# Patient Record
Sex: Male | Born: 1955 | Race: White | Hispanic: No | Marital: Married | Smoking: Former smoker
Health system: Southern US, Community
[De-identification: ages and names within clinical notes are randomized; demographics above are authoritative.]

## PROBLEM LIST (undated history)

## (undated) DIAGNOSIS — K219 Gastro-esophageal reflux disease without esophagitis: Secondary | ICD-10-CM

## (undated) DIAGNOSIS — I2510 Atherosclerotic heart disease of native coronary artery without angina pectoris: Secondary | ICD-10-CM

## (undated) DIAGNOSIS — R7303 Prediabetes: Secondary | ICD-10-CM

## (undated) DIAGNOSIS — I10 Essential (primary) hypertension: Secondary | ICD-10-CM

## (undated) DIAGNOSIS — R011 Cardiac murmur, unspecified: Secondary | ICD-10-CM

## (undated) DIAGNOSIS — M1990 Unspecified osteoarthritis, unspecified site: Secondary | ICD-10-CM

## (undated) HISTORY — DX: Atherosclerotic heart disease of native coronary artery without angina pectoris: I25.10

## (undated) HISTORY — PX: WISDOM TOOTH EXTRACTION: SHX21

## (undated) HISTORY — PX: ARTHROSCOPY KNEE W/ DRILLING: SUR92

## (undated) HISTORY — DX: Cardiac murmur, unspecified: R01.1

## (undated) HISTORY — PX: KNEE ARTHROSCOPY: SUR90

## (undated) SURGERY — COLONOSCOPY
Anesthesia: Monitor Anesthesia Care

---

## 2002-05-08 ENCOUNTER — Encounter: Payer: Self-pay | Admitting: Internal Medicine

## 2002-05-08 ENCOUNTER — Ambulatory Visit (HOSPITAL_COMMUNITY): Admission: RE | Admit: 2002-05-08 | Discharge: 2002-05-08 | Payer: Self-pay | Admitting: Internal Medicine

## 2014-11-01 ENCOUNTER — Other Ambulatory Visit: Payer: Self-pay | Admitting: Otolaryngology

## 2014-11-01 DIAGNOSIS — H905 Unspecified sensorineural hearing loss: Secondary | ICD-10-CM

## 2014-11-09 ENCOUNTER — Ambulatory Visit
Admission: RE | Admit: 2014-11-09 | Discharge: 2014-11-09 | Disposition: A | Discharge status: Home / Self Care | Payer: Self-pay | Source: Ambulatory Visit | Attending: Otolaryngology | Admitting: Otolaryngology

## 2014-11-09 DIAGNOSIS — H905 Unspecified sensorineural hearing loss: Secondary | ICD-10-CM

## 2014-11-09 MED ORDER — GADOBENATE DIMEGLUMINE 529 MG/ML IV SOLN
19.0000 mL | Freq: Once | INTRAVENOUS | Status: AC | PRN
  Administered 2014-11-09: 19 mL via INTRAVENOUS

## 2016-03-20 DIAGNOSIS — L821 Other seborrheic keratosis: Secondary | ICD-10-CM | POA: Diagnosis not present

## 2016-03-20 DIAGNOSIS — D229 Melanocytic nevi, unspecified: Secondary | ICD-10-CM | POA: Diagnosis not present

## 2016-03-20 DIAGNOSIS — L570 Actinic keratosis: Secondary | ICD-10-CM | POA: Diagnosis not present

## 2016-05-27 DIAGNOSIS — E785 Hyperlipidemia, unspecified: Secondary | ICD-10-CM | POA: Diagnosis not present

## 2016-05-27 DIAGNOSIS — R739 Hyperglycemia, unspecified: Secondary | ICD-10-CM | POA: Diagnosis not present

## 2016-05-27 DIAGNOSIS — Z79899 Other long term (current) drug therapy: Secondary | ICD-10-CM | POA: Diagnosis not present

## 2016-07-19 DIAGNOSIS — M1712 Unilateral primary osteoarthritis, left knee: Secondary | ICD-10-CM | POA: Diagnosis not present

## 2016-08-30 DIAGNOSIS — M1712 Unilateral primary osteoarthritis, left knee: Secondary | ICD-10-CM | POA: Diagnosis not present

## 2016-09-27 DIAGNOSIS — M1712 Unilateral primary osteoarthritis, left knee: Secondary | ICD-10-CM | POA: Diagnosis not present

## 2016-10-01 DIAGNOSIS — M1712 Unilateral primary osteoarthritis, left knee: Secondary | ICD-10-CM | POA: Diagnosis not present

## 2016-10-01 DIAGNOSIS — M238X2 Other internal derangements of left knee: Secondary | ICD-10-CM | POA: Diagnosis not present

## 2016-10-15 DIAGNOSIS — G8918 Other acute postprocedural pain: Secondary | ICD-10-CM | POA: Diagnosis not present

## 2016-10-15 DIAGNOSIS — M659 Synovitis and tenosynovitis, unspecified: Secondary | ICD-10-CM | POA: Diagnosis not present

## 2016-10-15 DIAGNOSIS — M23322 Other meniscus derangements, posterior horn of medial meniscus, left knee: Secondary | ICD-10-CM | POA: Diagnosis not present

## 2016-10-15 DIAGNOSIS — M94262 Chondromalacia, left knee: Secondary | ICD-10-CM | POA: Diagnosis not present

## 2016-10-15 DIAGNOSIS — M23222 Derangement of posterior horn of medial meniscus due to old tear or injury, left knee: Secondary | ICD-10-CM | POA: Diagnosis not present

## 2016-11-27 DIAGNOSIS — Z1211 Encounter for screening for malignant neoplasm of colon: Secondary | ICD-10-CM | POA: Diagnosis not present

## 2016-11-27 DIAGNOSIS — R739 Hyperglycemia, unspecified: Secondary | ICD-10-CM | POA: Diagnosis not present

## 2016-11-27 DIAGNOSIS — D7589 Other specified diseases of blood and blood-forming organs: Secondary | ICD-10-CM | POA: Diagnosis not present

## 2016-11-27 DIAGNOSIS — E785 Hyperlipidemia, unspecified: Secondary | ICD-10-CM | POA: Diagnosis not present

## 2016-11-27 DIAGNOSIS — I10 Essential (primary) hypertension: Secondary | ICD-10-CM | POA: Diagnosis not present

## 2016-11-27 DIAGNOSIS — Z23 Encounter for immunization: Secondary | ICD-10-CM | POA: Diagnosis not present

## 2017-06-03 DIAGNOSIS — I10 Essential (primary) hypertension: Secondary | ICD-10-CM | POA: Diagnosis not present

## 2017-06-03 DIAGNOSIS — E785 Hyperlipidemia, unspecified: Secondary | ICD-10-CM | POA: Diagnosis not present

## 2017-06-03 DIAGNOSIS — R739 Hyperglycemia, unspecified: Secondary | ICD-10-CM | POA: Diagnosis not present

## 2017-08-27 DIAGNOSIS — S91302A Unspecified open wound, left foot, initial encounter: Secondary | ICD-10-CM | POA: Diagnosis not present

## 2017-12-02 DIAGNOSIS — I10 Essential (primary) hypertension: Secondary | ICD-10-CM | POA: Diagnosis not present

## 2017-12-02 DIAGNOSIS — E785 Hyperlipidemia, unspecified: Secondary | ICD-10-CM | POA: Diagnosis not present

## 2017-12-02 DIAGNOSIS — R739 Hyperglycemia, unspecified: Secondary | ICD-10-CM | POA: Diagnosis not present

## 2017-12-02 DIAGNOSIS — Z23 Encounter for immunization: Secondary | ICD-10-CM | POA: Diagnosis not present

## 2018-02-24 DIAGNOSIS — Z0389 Encounter for observation for other suspected diseases and conditions ruled out: Secondary | ICD-10-CM | POA: Diagnosis not present

## 2018-03-06 ENCOUNTER — Other Ambulatory Visit: Payer: Self-pay | Admitting: Family Medicine

## 2018-03-06 ENCOUNTER — Ambulatory Visit
Admission: RE | Admit: 2018-03-06 | Discharge: 2018-03-06 | Disposition: A | Discharge status: Home / Self Care | Payer: 59 | Source: Ambulatory Visit | Attending: Family Medicine | Admitting: Family Medicine

## 2018-03-06 DIAGNOSIS — Q766 Other congenital malformations of ribs: Secondary | ICD-10-CM

## 2018-03-06 DIAGNOSIS — M954 Acquired deformity of chest and rib: Secondary | ICD-10-CM | POA: Diagnosis not present

## 2018-11-24 ENCOUNTER — Other Ambulatory Visit: Payer: Self-pay | Admitting: Family Medicine

## 2018-11-24 DIAGNOSIS — E041 Nontoxic single thyroid nodule: Secondary | ICD-10-CM

## 2018-12-02 ENCOUNTER — Ambulatory Visit
Admission: RE | Admit: 2018-12-02 | Discharge: 2018-12-02 | Disposition: A | Discharge status: Home / Self Care | Payer: 59 | Source: Ambulatory Visit | Attending: Family Medicine | Admitting: Family Medicine

## 2018-12-02 ENCOUNTER — Other Ambulatory Visit: Payer: Self-pay

## 2018-12-02 DIAGNOSIS — E041 Nontoxic single thyroid nodule: Secondary | ICD-10-CM

## 2018-12-02 MED ORDER — IOPAMIDOL (ISOVUE-300) INJECTION 61%
75.0000 mL | Freq: Once | INTRAVENOUS | Status: AC | PRN
  Administered 2018-12-02: 75 mL via INTRAVENOUS

## 2019-05-16 ENCOUNTER — Ambulatory Visit: Payer: Self-pay | Attending: Internal Medicine

## 2019-05-16 DIAGNOSIS — Z23 Encounter for immunization: Secondary | ICD-10-CM | POA: Insufficient documentation

## 2019-05-16 NOTE — Progress Notes (Signed)
   Covid-19 Vaccination Clinic  Name:  TEMUJIN CAPTAIN    MRN: [id number] DOB: December 03, 1955  05/16/2019  Mr. Turton was observed post Covid-19 immunization for 15 minutes without incident. He was provided with Vaccine Information Sheet and instruction to access the V-Safe system.   Mr. Helming was instructed to call 911 with any severe reactions post vaccine: [name] Kitchen Difficulty breathing  . Swelling of face and throat  . A fast heartbeat  . A bad rash all over body  . Dizziness and weakness   Immunizations Administered    Name Date Dose VIS Date Route   Pfizer COVID-19 Vaccine 05/16/2019 12:51 PM 0.3 mL 02/19/2019 Intramuscular   Manufacturer: [place]   Lot: [id number]   [place]: [id number]

## 2019-06-07 ENCOUNTER — Ambulatory Visit: Payer: Self-pay | Attending: Internal Medicine

## 2019-06-07 DIAGNOSIS — Z23 Encounter for immunization: Secondary | ICD-10-CM

## 2019-06-07 NOTE — Progress Notes (Signed)
   Covid-19 Vaccination Clinic  Name:  Glen Carrillo    MRN: [id number] DOB: 1955-03-19  06/07/2019  Mr. Glen Carrillo was observed post Covid-19 immunization for 15 minutes without incident. He was provided with Vaccine Information Sheet and instruction to access the V-Safe system.   Mr. Glen Carrillo was instructed to call 911 with any severe reactions post vaccine: [name] Kitchen Difficulty breathing  . Swelling of face and throat  . A fast heartbeat  . A bad rash all over body  . Dizziness and weakness   Immunizations Administered    Name Date Dose VIS Date Route   Pfizer COVID-19 Vaccine 06/07/2019  8:08 AM 0.3 mL 02/19/2019 Intramuscular   Manufacturer: [place]   Lot: [id number]   [place]: [id number]

## 2019-06-15 ENCOUNTER — Ambulatory Visit: Payer: Self-pay

## 2020-09-19 ENCOUNTER — Other Ambulatory Visit: Payer: Self-pay | Admitting: Family Medicine

## 2020-09-19 DIAGNOSIS — Z122 Encounter for screening for malignant neoplasm of respiratory organs: Secondary | ICD-10-CM

## 2020-10-13 ENCOUNTER — Ambulatory Visit
Admission: RE | Admit: 2020-10-13 | Discharge: 2020-10-13 | Disposition: A | Discharge status: Home / Self Care | Payer: Medicare Other | Source: Ambulatory Visit | Attending: Family Medicine | Admitting: Family Medicine

## 2020-10-13 DIAGNOSIS — Z122 Encounter for screening for malignant neoplasm of respiratory organs: Secondary | ICD-10-CM

## 2020-12-12 NOTE — H&P (Signed)
  Patient's anticipated LOS is less than 2 midnights, meeting these requirements: - Younger than 81 - Lives within 1 hour of care - Has a competent adult at home to recover with post-op recover - NO history of  - Chronic pain requiring opiods  - Diabetes  - Coronary Artery Disease  - Heart failure  - Heart attack  - Stroke  - DVT/VTE  - Cardiac arrhythmia  - Respiratory Failure/COPD  - Renal failure  - Anemia  - Advanced Liver disease     ADYEN BIFULCO is an 65 y.o. male.    Chief Complaint: left shoulder pain  HPI: Pt is a 65 y.o. male complaining of left shoulder pain for multiple years. Pain had continually increased since the beginning. X-rays in the clinic show end-stage arthritic changes of the left shoulder. Pt has tried various conservative treatments which have failed to alleviate their symptoms, including injections and therapy. Various options are discussed with the patient. Risks, benefits and expectations were discussed with the patient. Patient understand the risks, benefits and expectations and wishes to proceed with surgery.   PCP:  Aura Dials, MD  D/C Plans: Home  PMH: No past medical history on file.    Social History:  has no history on file for tobacco use, alcohol use, and drug use.  Allergies:  Not on File  Medications: No current facility-administered medications for this encounter.   No current outpatient medications on file.    No results found for this or any previous visit (from the past 48 hour(s)). No results found.  ROS: Pain with rom of the left upper extremity  Physical Exam: Alert and oriented 65 y.o. male in no acute distress Cranial nerves 2-12 intact Cervical spine: full rom with no tenderness, nv intact distally Chest: active breath sounds bilaterally, no wheeze rhonchi or rales Heart: regular rate and rhythm, no murmur Abd: non tender non distended with active bowel sounds Hip is stable with rom  Left shoulder  painful rom with crepitus  Nv intact distally No rashes or edema distally  Assessment/Plan Assessment: left shoulder end stage osteoarthritis  Plan:  Patient will undergo a left total shoulder by Dr. Veverly Fells at [facility] Risks benefits and expectations were discussed with the patient. Patient understand risks, benefits and expectations and wishes to proceed. Preoperative templating of the joint replacement has been completed, documented, and submitted to the Operating Room personnel in order to optimize intra-operative equipment management.   Merla Riches PA-C, MPAS [facility] is now [employer] [address] [place], [place], [place] [address] Phone: [phone] [email or web address] Facebook  [employer]

## 2020-12-20 NOTE — Progress Notes (Signed)
Surgical Instructions    Your procedure is scheduled on 12/25/20.  Report to [facility] Main Entrance "A" at 8:40 A.M., then check in with the Admitting office.  Call this number if you have problems the morning of surgery:  [phone]   If you have any questions prior to your surgery date call [phone]: Open Monday-Friday 8am-4pm    Remember:  Do not eat after midnight the night before your surgery  You may drink clear liquids until 7:40 the morning of your surgery.   Clear liquids allowed are: Water, Non-Citrus Juices (without pulp), Carbonated Beverages, Clear Tea, Black Coffee ONLY (NO MILK, CREAM OR POWDERED CREAMER of any kind), and Gatorade  Please complete your PRE-SURGERY ENSURE that was provided to you by 7:40 am the morning of surgery.  Please, if able, drink it in one setting. DO NOT SIP.     Take these medicines the morning of surgery with A SIP OF WATER:  atorvastatin (LIPITOR)  omeprazole (PRILOSEC) acetaminophen (TYLENOL) - if needed   As of today, STOP taking any Aspirin (unless otherwise instructed by your surgeon) Aleve, Naproxen, Ibuprofen, Motrin, Advil, Goody's, BC's, all herbal medications, fish oil, and all vitamins.   After your COVID test   You are not required to quarantine however you are required to wear a well-fitting mask when you are out and around people not in your household.  If your mask becomes wet or soiled, replace with a new one.  Wash your hands often with soap and water for 20 seconds or clean your hands with an alcohol-based hand sanitizer that contains at least 60% alcohol.  Do not share personal items.  Notify your provider: if you are in close contact with someone who has COVID  or if you develop a fever of 100.4 or greater, sneezing, cough, sore throat, shortness of breath or body aches.           Do not wear jewelry  Do not wear lotions, powders, colognes, or deodorant. Do not shave 48 hours prior to surgery.  Men may  shave face and neck. Do not bring valuables to the hospital.              [facility] is not responsible for any belongings or valuables.  Do NOT Smoke (Tobacco/Vaping)  24 hours prior to your procedure  If you use a CPAP at night, you may bring your mask for your overnight stay.   Contacts, glasses, hearing aids, dentures or partials may not be worn into surgery, please bring cases for these belongings   For patients admitted to the hospital, discharge time will be determined by your treatment team.   Patients discharged the day of surgery will not be allowed to drive home, and someone needs to stay with them for 24 hours.  NO VISITORS WILL BE ALLOWED IN PRE-OP WHERE PATIENTS ARE PREPPED FOR SURGERY.  ONLY 1 SUPPORT PERSON MAY BE PRESENT IN THE WAITING ROOM WHILE YOU ARE IN SURGERY.  IF YOU ARE TO BE ADMITTED, ONCE YOU ARE IN YOUR ROOM YOU WILL BE ALLOWED TWO (2) VISITORS. 1 (ONE) VISITOR MAY STAY OVERNIGHT BUT MUST ARRIVE TO THE ROOM BY 8pm.  Minor children may have two parents present. Special consideration for safety and communication needs will be reviewed on a case by case basis.  Special instructions:    Oral Hygiene is also important to reduce your risk of infection.  Remember - BRUSH YOUR TEETH THE MORNING OF SURGERY WITH YOUR REGULAR  TOOTHPASTE                                  [facility]- Preparing for Total Shoulder Arthroplasty   Before surgery, you can play an important role. Because skin is not sterile, your skin needs to be as free of germs as possible. You can reduce the number of germs on your skin by using the following products. Benzoyl Peroxide Gel Reduces the number of germs present on the skin Applied twice a day to shoulder area starting two days before surgery   Chlorhexidine Gluconate (CHG) Soap An antiseptic cleaner that kills germs and bonds with the skin to continue killing germs even after washing Used for showering the night before surgery and morning of  surgery   Oral Hygiene is also important to reduce your risk of infection.                                    Remember - BRUSH YOUR TEETH THE MORNING OF SURGERY WITH YOUR REGULAR TOOTHPASTE  ==================================================================  Please follow these instructions carefully:  BENZOYL PEROXIDE 5% GEL  Please do not use if you have an allergy to benzoyl peroxide.   If your skin becomes reddened/irritated stop using the benzoyl peroxide.  Starting two days before surgery, apply as follows: Apply benzoyl peroxide in the morning and at night. Apply after taking a shower. If you are not taking a shower clean entire shoulder front, back, and side along with the armpit with a clean wet washcloth.  Place a quarter-sized dollop on your shoulder and rub in thoroughly, making sure to cover the front, back, and side of your shoulder, along with the armpit.   2 days before ____ AM   ____ PM              1 day before ____ AM   ____ PM                            Do this twice a day for two days.  (Last application is the night before surgery, AFTER using the CHG soap as described below).  Do NOT apply benzoyl peroxide gel on the day of surgery.  CHLORHEXIDINE GLUCONATE (CHG) SOAP  Please do not use if you have an allergy to CHG or antibacterial soaps. If your skin becomes reddened/irritated stop using the CHG.   Do not shave (including legs and underarms) for at least 48 hours prior to first CHG shower. It is OK to shave your face.  Starting the night before surgery, use CHG soap as follows:  Shower the NIGHT BEFORE SURGERY and MORNING OF SURGERY with CHG.  If you choose to wash your hair, wash your hair first as usual with your normal shampoo.  After shampooing, rinse your hair and body thoroughly to remove the shampoo.  Use CHG as you would any other liquid soap.  You can apply CHG directly to the skin and wash gently with a scrungie or a clean washcloth.  Apply  the CHG soap to your body ONLY FROM THE NECK DOWN.  Do not use on open wounds or open sores.  Avoid contact with your eyes, ears, mouth, and genitals (private parts).  Wash face and genitals (private parts) with your normal soap.  Wash thoroughly, paying  special attention to the area where your surgery will be performed.  Thoroughly rinse your body with warm water from the neck down.  DO NOT shower/wash with your normal soap after using and rinsing off the CHG soap.   Pat yourself dry with a CLEAN TOWEL.    Apply benzoyl peroxide.   Wear CLEAN PAJAMAS to bed the night before surgery; wear comfortable clothes the morning of surgery.  Place CLEAN SHEETS on your bed the night of your first shower and DO NOT SLEEP WITH PETS.  Day of Surgery: Shower as above Do not apply any deodorants/lotions.  Please wear clean clothes to the hospital/surgery center.   Remember to brush your teeth WITH YOUR REGULAR TOOTHPASTE.       Please read over the following fact sheets that you were given.

## 2020-12-21 ENCOUNTER — Encounter (HOSPITAL_COMMUNITY): Payer: Self-pay

## 2020-12-21 ENCOUNTER — Other Ambulatory Visit: Payer: Self-pay

## 2020-12-21 ENCOUNTER — Encounter (HOSPITAL_COMMUNITY)
Admission: RE | Admit: 2020-12-21 | Discharge: 2020-12-21 | Disposition: A | Discharge status: Home / Self Care | Payer: Medicare Other | Source: Ambulatory Visit | Attending: Orthopedic Surgery | Admitting: Orthopedic Surgery

## 2020-12-21 DIAGNOSIS — Z20822 Contact with and (suspected) exposure to covid-19: Secondary | ICD-10-CM | POA: Insufficient documentation

## 2020-12-21 DIAGNOSIS — Z01818 Encounter for other preprocedural examination: Secondary | ICD-10-CM | POA: Insufficient documentation

## 2020-12-21 HISTORY — DX: Unspecified osteoarthritis, unspecified site: M19.90

## 2020-12-21 HISTORY — DX: Gastro-esophageal reflux disease without esophagitis: K21.9

## 2020-12-21 HISTORY — DX: Essential (primary) hypertension: I10

## 2020-12-21 LAB — BASIC METABOLIC PANEL
Anion gap: 8 (ref 5–15)
BUN: 21 mg/dL (ref 8–23)
CO2: 28 mmol/L (ref 22–32)
Calcium: 9.7 mg/dL (ref 8.9–10.3)
Chloride: 104 mmol/L (ref 98–111)
Creatinine, Ser: 1.02 mg/dL (ref 0.61–1.24)
GFR, Estimated: >60 mL/min (ref >60)
Glucose, Bld: 121 mg/dL — ABNORMAL HIGH (ref 70–99)
Potassium: 4.3 mmol/L (ref 3.5–5.1)
Sodium: 140 mmol/L (ref 135–145)

## 2020-12-21 LAB — CBC
HCT: 35.4 % — ABNORMAL LOW (ref 39.0–52.0)
Hemoglobin: 12 g/dL — ABNORMAL LOW (ref 13.0–17.0)
MCH: 33.1 pg (ref 26.0–34.0)
MCHC: 33.9 g/dL (ref 30.0–36.0)
MCV: 97.5 fL (ref 80.0–100.0)
Platelets: 309 10*3/uL (ref 150–400)
RBC: 3.63 MIL/uL — ABNORMAL LOW (ref 4.22–5.81)
RDW: 12.6 % (ref 11.5–15.5)
WBC: 9 10*3/uL (ref 4.0–10.5)
nRBC: 0 % (ref 0.0–0.2)

## 2020-12-21 LAB — SURGICAL PCR SCREEN
MRSA, PCR: NEGATIVE
Staphylococcus aureus: NEGATIVE

## 2020-12-21 LAB — SARS CORONAVIRUS 2 (TAT 6-24 HRS): SARS Coronavirus 2: NEGATIVE

## 2020-12-21 NOTE — Progress Notes (Addendum)
PCP: Ammie Dalton, MD Cardiologist: denies  EKG: 12/21/20 CXR: na ECHO: denies Stress Test: denies Cardiac Cath: denies  Fasting Blood Sugar- na Checks Blood Sugar__na_ times a day  ASA/Blood Thinner: No  OSA/CPAP: NO  Covid test 12/21/20 at PAT  Anesthesia Review: Yes, abnormal EKG  Patient denies shortness of breath, fever, cough, and chest pain at PAT appointment.  Patient verbalized understanding of instructions provided today at the PAT appointment.  Patient asked to review instructions at home and day of surgery.

## 2020-12-22 ENCOUNTER — Encounter (HOSPITAL_COMMUNITY): Payer: Self-pay | Admitting: Physician Assistant

## 2020-12-22 ENCOUNTER — Telehealth: Payer: Self-pay | Admitting: *Deleted

## 2020-12-22 NOTE — Telephone Encounter (Signed)
Per message below from Dr. Angelena Form I tried to reach patient to schedule a new patient appointment for next week.  No answer, unable to leave a message.  Spot on hold currently for 9:40 with Dr. Oval Linsey at [facility].

## 2020-12-22 NOTE — Telephone Encounter (Signed)
-----   Message from Burnell Blanks, MD sent at 12/22/2020 11:24 AM EDT ----- Marykay Lex. I am the EKG reader and this man has an abnormal resting EKG. The PA in short stay asked me to help. They are going to cancel his elective shoulder surgery for next week. Can we get him on a DOD schedule next week? Thanks, chris

## 2020-12-25 ENCOUNTER — Encounter (HOSPITAL_COMMUNITY): Admission: RE | Payer: Self-pay | Source: Home / Self Care

## 2020-12-25 ENCOUNTER — Ambulatory Visit (INDEPENDENT_AMBULATORY_CARE_PROVIDER_SITE_OTHER): Payer: Medicare Other | Admitting: Cardiovascular Disease

## 2020-12-25 ENCOUNTER — Ambulatory Visit (HOSPITAL_COMMUNITY): Admission: RE | Admit: 2020-12-25 | Payer: Medicare Other | Source: Home / Self Care | Admitting: Orthopedic Surgery

## 2020-12-25 ENCOUNTER — Other Ambulatory Visit: Payer: Self-pay

## 2020-12-25 ENCOUNTER — Encounter (HOSPITAL_BASED_OUTPATIENT_CLINIC_OR_DEPARTMENT_OTHER): Payer: Self-pay | Admitting: Cardiovascular Disease

## 2020-12-25 VITALS — BP 114/76 | HR 51 | Ht 71.0 in | Wt 198.3 lb

## 2020-12-25 DIAGNOSIS — I441 Atrioventricular block, second degree: Secondary | ICD-10-CM

## 2020-12-25 DIAGNOSIS — E7800 Pure hypercholesterolemia, unspecified: Secondary | ICD-10-CM | POA: Insufficient documentation

## 2020-12-25 DIAGNOSIS — R0683 Snoring: Secondary | ICD-10-CM | POA: Diagnosis not present

## 2020-12-25 DIAGNOSIS — I10 Essential (primary) hypertension: Secondary | ICD-10-CM

## 2020-12-25 DIAGNOSIS — R42 Dizziness and giddiness: Secondary | ICD-10-CM

## 2020-12-25 DIAGNOSIS — R0681 Apnea, not elsewhere classified: Secondary | ICD-10-CM

## 2020-12-25 HISTORY — DX: Atrioventricular block, second degree: I44.1

## 2020-12-25 HISTORY — DX: Pure hypercholesterolemia, unspecified: E78.00

## 2020-12-25 HISTORY — DX: Essential (primary) hypertension: I10

## 2020-12-25 SURGERY — ARTHROPLASTY, SHOULDER, TOTAL
Anesthesia: General | Site: Shoulder | Laterality: Left

## 2020-12-25 NOTE — Assessment & Plan Note (Signed)
Blood pressure well have been controlled on lisinopril/HCTZ.

## 2020-12-25 NOTE — Assessment & Plan Note (Signed)
He is struggled with myalgias and reduced his atorvastatin on his own to 40 mg.  He has also worked on his diet.  He is unable to get regular exercise due to his multiple orthopedic issues.  He made this change about a month ago.  Repeat fasting lipids and a CMP in about 2 to 3 months.

## 2020-12-25 NOTE — Assessment & Plan Note (Signed)
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  It seems as though he is asymptomatic.  This has been documented in his PCPs office as low as a ventricular rate of 39 bpm.  He has had a couple episodes of what he describes as vertigo lasting for a minute or so while driving.  He denies any orthostatic symptoms and in general has no lightheadedness or presyncope.  He did have 1 episode of syncope 8 years ago that occurred in the setting of pain and getting up to urinate in the middle of the night which was likely vasovagal.  He has not been on any nodal agents.  There is no indication for pacemaker at this time as it seems he is asymptomatic.  His episodes of vertigo have been so sporadic that wearing an ambulatory monitor is not going to be helpful.  I did advise him that if he starts having episodes more frequently to let us know so that we can place a monitor.  Given that his 2 episodes of vertigo occurred when driving, I am concerned that there may have been a carotid hypersensitivity component or carotid artery disease elicited by turning of his head.  We will check carotid Dopplers.  He is here today for surgical assessment.  No plans for pacemaker implantation.  We will check a sleep study and recommend that anesthesia be ready with a beta agonist in case he does become bradycardic with induction of anesthesia.  However I suspect that he will do fine.  He has no evidence of any structural heart disease or heart failure so no echo indicated at this time.  We will check a TSH.
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
## 2020-12-25 NOTE — Patient Instructions (Addendum)
Medication Instructions:  Your physician recommends that you continue on your current medications as directed. Please refer to the Current Medication list given to you today.   *If you need a refill on your cardiac medications before your next appointment, please call your pharmacy*  Lab Work: TSH TODAY   FASTING LP/CMET IN 2 MONTHS   If you have labs (blood work) drawn today and your tests are completely normal, you will receive your results only by: [place] (if you have MyChart) OR A paper copy in the mail If you have any lab test that is abnormal or we need to change your treatment, we will call you to review the results.  Testing/Procedures: Your physician has recommended that you have a sleep study. This test records several body functions during sleep, including: brain activity, eye movement, oxygen and carbon dioxide blood levels, heart rate and rhythm, breathing rate and rhythm, the flow of air through your mouth and nose, snoring, body muscle movements, and chest and belly movement. ONCE INSURANCE HAS BEEN REVIEWED THE OFFICE WILL CALL YOU TO SCHEDULE IF YOU DO NOT HEAR IN 2 WEEKS CALL THE OFFICE TO FOLLOW UP   Your physician has requested that you have a carotid duplex. This test is an ultrasound of the carotid arteries in your neck. It looks at blood flow through these arteries that supply the brain with blood. Allow one hour for this exam. There are no restrictions or special instructions.  Follow-Up: At [facility], you and your health needs are our priority.  As part of our continuing mission to provide you with exceptional heart care, we have created designated Provider Care Teams.  These Care Teams include your primary Cardiologist (physician) and Advanced Practice Providers (APPs -  Physician Assistants and Nurse Practitioners) who all work together to provide you with the care you need, when you need it.  We recommend signing up for the patient portal called  "MyChart".  Sign up information is provided on this After Visit Summary.  MyChart is used to connect with patients for Virtual Visits (Telemedicine).  Patients are able to view lab/test results, encounter notes, upcoming appointments, etc.  Non-urgent messages can be sent to your provider as well.   To learn more about what you can do with MyChart, go to [email or web address].    Your next appointment:   6 month(s)  The format for your next appointment:   In Person  Provider:   Skeet Latch, MD  Other Instructions  YOU ARE [place]

## 2020-12-26 LAB — TSH: TSH: 1.27 u[IU]/mL (ref 0.450–4.500)

## 2021-01-01 ENCOUNTER — Other Ambulatory Visit: Payer: Self-pay

## 2021-01-01 ENCOUNTER — Ambulatory Visit (INDEPENDENT_AMBULATORY_CARE_PROVIDER_SITE_OTHER): Payer: Medicare Other

## 2021-01-01 DIAGNOSIS — R42 Dizziness and giddiness: Secondary | ICD-10-CM

## 2021-01-09 ENCOUNTER — Encounter (HOSPITAL_BASED_OUTPATIENT_CLINIC_OR_DEPARTMENT_OTHER): Payer: Self-pay

## 2021-02-05 NOTE — Progress Notes (Signed)
Orders requested via Epic in box.

## 2021-02-06 NOTE — H&P (Signed)
Patient's anticipated LOS is less than 2 midnights, meeting these requirements: - Younger than 78 - Lives within 1 hour of care - Has a competent adult at home to recover with post-op recover - NO history of  - Chronic pain requiring opiods  - Diabetes  - Coronary Artery Disease  - Heart failure  - Heart attack  - Stroke  - DVT/VTE  - Cardiac arrhythmia  - Respiratory Failure/COPD  - Renal failure  - Anemia  - Advanced Liver disease     Glen Carrillo is an 65 y.o. male.    Chief Complaint: left shoulder pain  HPI: Pt is a 65 y.o. male complaining of left shoulder pain for multiple years. Pain had continually increased since the beginning. X-rays in the clinic show end-stage arthritic changes of the left shoulder. Pt has tried various conservative treatments which have failed to alleviate their symptoms, including injections and therapy. Various options are discussed with the patient. Risks, benefits and expectations were discussed with the patient. Patient understand the risks, benefits and expectations and wishes to proceed with surgery.   PCP:  Jamie Kato  D/C Plans: Home  PMH: Past Medical History:  Diagnosis Date   Arthritis    Essential hypertension 12/25/2020   GERD (gastroesophageal reflux disease)    Hypertension    Pure hypercholesterolemia 12/25/2020   Second degree atrioventricular block, Mobitz (type) I 12/25/2020    PSH: Past Surgical History:  Procedure Laterality Date   KNEE ARTHROSCOPY      Social History:  reports that he has quit smoking. His smoking use included cigarettes. He has never used smokeless tobacco. He reports current alcohol use. He reports that he does not use drugs.  Allergies:  Allergies  Allergen Reactions   Metformin And Related     swelling    Medications: No current facility-administered medications for this encounter.   Current Outpatient Medications  Medication Sig Dispense Refill   acetaminophen  (TYLENOL) 500 MG tablet Take 1,000 mg by mouth every 6 (six) hours as needed for moderate pain.     atorvastatin (LIPITOR) 80 MG tablet Take 80 mg by mouth daily.     Coenzyme Q10 (CO Q-10) 100 MG CAPS Take 100 mg by mouth daily.     lisinopril-hydrochlorothiazide (ZESTORETIC) 20-25 MG tablet Take 1 tablet by mouth daily.     Multiple Vitamin (MULTIVITAMIN WITH MINERALS) TABS tablet Take 1 tablet by mouth daily.     omeprazole (PRILOSEC) 40 MG capsule Take 40 mg by mouth daily.      No results found for this or any previous visit (from the past 48 hour(s)). No results found.  ROS: Pain with rom of the left upper extremity  Physical Exam: Alert and oriented 65 y.o. male in no acute distress Cranial nerves 2-12 intact Cervical spine: full rom with no tenderness, nv intact distally Chest: active breath sounds bilaterally, no wheeze rhonchi or rales Heart: regular rate and rhythm, no murmur Abd: non tender non distended with active bowel sounds Hip is stable with rom  Left shoulder painful rom Strength of ER and IR 5/5 bilaterally No rashes or edema  Assessment/Plan Assessment: left shoulder end stage osteoarthritis  Plan:  Patient will undergo a left total shoulder by Dr. Veverly Fells at [facility] Risks benefits and expectations were discussed with the patient. Patient understand risks, benefits and expectations and wishes to proceed. Preoperative templating of the joint replacement has been completed, documented, and submitted to the Operating Room personnel in  order to optimize intra-operative equipment management.   Merla Riches PA-C, [facility] is now [employer] [facility] [address] [place], [place], [place] [address] Phone: [phone] [email or web address]  [employer]

## 2021-02-08 NOTE — Patient Instructions (Signed)
DUE TO COVID-19 ONLY ONE VISITOR IS ALLOWED TO COME WITH YOU AND STAY IN THE WAITING ROOM ONLY DURING PRE OP AND PROCEDURE.   **NO VISITORS ARE ALLOWED IN THE SHORT STAY AREA OR RECOVERY ROOM!!**  IF YOU WILL BE ADMITTED INTO THE HOSPITAL YOU ARE ALLOWED ONLY TWO SUPPORT PEOPLE DURING VISITATION HOURS ONLY (7 AM -8PM)   The support person(s) must pass our screening, gel in and out, and wear a mask at all times, including in the patient's room. Patients must also wear a mask when staff or their support person are in the room. Visitors GUEST BADGE MUST BE WORN VISIBLY  One adult visitor may remain with you overnight and MUST be in the room by 8 P.M.  No visitors under the age of 81. Any visitor under the age of 30 must be accompanied by an adult.    COVID SWAB TESTING MUST BE COMPLETED ON:02/21/21                  8A - 3P   **MUST PRESENT COMPLETED FORM AT TESTING SITE**    [place]. [place] (backside of the building) You are not required to quarantine, however you are required to wear a well-fitted mask when you are out and around people not in your household.  Hand Hygiene often Do NOT share personal items Notify your provider if you are in close contact with someone who has COVID or you develop fever 100.4 or greater, new onset of sneezing, cough, sore throat, shortness of breath or body aches.  [place], [address], must go inside of the hospital, NOT A DRIVE THRU!  (Must self quarantine after testing. Follow instructions on handout.)       Your procedure is scheduled on: 02/23/21   Report to [facility] Main Entrance    Report to admitting at : 9:45 AM   Call this number if you have problems the morning of surgery [phone]   Do not eat food :After Midnight.   May have liquids until : 9:30 AM   day of surgery  CLEAR LIQUID DIET  Foods Allowed                                                                      Foods Excluded  Water, Black Coffee and tea, regular and decaf                             liquids that you cannot  Plain Jell-O in any flavor  (No red)                                           see through such as: Fruit ices (not with fruit pulp)                                     milk, soups, orange juice  Iced Popsicles (No red)                                    All solid food                                   Apple juices Sports drinks like Gatorade (No red) Lightly seasoned clear broth or consume(fat free) Sugar  Sample Menu Breakfast                                Lunch                                     Supper Cranberry juice                    Beef broth                            Chicken broth Jell-O                                     Grape juice                           Apple juice Coffee or tea                        Jell-O                                      Popsicle                                                Coffee or tea                        Coffee or tea      Complete one Ensure drink the morning of surgery at: 9:30 AM       the day of surgery.    The day of surgery:  Drink ONE (1) Pre-Surgery Clear Ensure or G2 by am the morning of surgery. Drink in one sitting. Do not sip.  This drink was given to you during your hospital  pre-op appointment visit. Nothing else to drink after completing the  Pre-Surgery Clear Ensure or G2.          If you have questions, please contact your surgeon's office.     Oral Hygiene is also important to reduce your risk of infection.                                    Remember - BRUSH YOUR TEETH THE MORNING OF SURGERY WITH YOUR REGULAR TOOTHPASTE   Do NOT smoke after Midnight   Take these medicines  the morning of surgery with A SIP OF WATER: omeprazole.                              You may not have any metal on your body including hair pins, jewelry, and body piercing             Do  not wear lotions, powders, perfumes/cologne, or deodorant              Men may shave face and neck.   Do not bring valuables to the hospital. [place].   Contacts, dentures or bridgework may not be worn into surgery.   Bring small overnight bag day of surgery.    Patients discharged on the day of surgery will not be allowed to drive home.   Special Instructions: Bring a copy of your healthcare power of attorney and living will documents         the day of surgery if you haven't scanned them before.              Please read over the following fact sheets you were given: IF YOU HAVE QUESTIONS ABOUT YOUR PRE-OP INSTRUCTIONS PLEASE CALL [phone]     [facility] - Preparing for Surgery Before surgery, you can play an important role.  Because skin is not sterile, your skin needs to be as free of germs as possible.  You can reduce the number of germs on your skin by washing with CHG (chlorahexidine gluconate) soap before surgery.  CHG is an antiseptic cleaner which kills germs and bonds with the skin to continue killing germs even after washing. Please DO NOT use if you have an allergy to CHG or antibacterial soaps.  If your skin becomes reddened/irritated stop using the CHG and inform your nurse when you arrive at Short Stay. Do not shave (including legs and underarms) for at least 48 hours prior to the first CHG shower.  You may shave your face/neck. Please follow these instructions carefully:  1.  Shower with CHG Soap the night before surgery and the  morning of Surgery.  2.  If you choose to wash your hair, wash your hair first as usual with your  normal  shampoo.  3.  After you shampoo, rinse your hair and body thoroughly to remove the  shampoo.                           4.  Use CHG as you would any other liquid soap.  You can apply chg directly  to the skin and wash                       Gently with a scrungie or clean washcloth.  5.   Apply the CHG Soap to your body ONLY FROM THE NECK DOWN.   Do not use on face/ open                           Wound or open sores. Avoid contact with eyes, ears mouth and genitals (private parts).                       Wash face,  Genitals (private parts) with your normal  soap.             6.  Wash thoroughly, paying special attention to the area where your surgery  will be performed.  7.  Thoroughly rinse your body with warm water from the neck down.  8.  DO NOT shower/wash with your normal soap after using and rinsing off  the CHG Soap.                9.  Pat yourself dry with a clean towel.            10.  Wear clean pajamas.            11.  Place clean sheets on your bed the night of your first shower and do not  sleep with pets. Day of Surgery : Do not apply any lotions/deodorants the morning of surgery.  Please wear clean clothes to the hospital/surgery center.  FAILURE TO FOLLOW THESE INSTRUCTIONS MAY RESULT IN THE CANCELLATION OF YOUR SURGERY PATIENT SIGNATURE_________________________________  NURSE SIGNATURE__________________________________  ________________________________________________________________________ [facility]- Preparing for Total Shoulder Arthroplasty    Before surgery, you can play an important role. Because skin is not sterile, your skin needs to be as free of germs as possible. You can reduce the number of germs on your skin by using the following products. Benzoyl Peroxide Gel Reduces the number of germs present on the skin Applied twice a day to shoulder area starting two days before surgery    ==================================================================  Please follow these instructions carefully:  BENZOYL PEROXIDE 5% GEL  Please do not use if you have an allergy to benzoyl peroxide.   If your skin becomes reddened/irritated stop using the benzoyl peroxide.  Starting two days before surgery, apply as follows: Apply benzoyl peroxide in the morning  and at night. Apply after taking a shower. If you are not taking a shower clean entire shoulder front, back, and side along with the armpit with a clean wet washcloth.  Place a quarter-sized dollop on your shoulder and rub in thoroughly, making sure to cover the front, back, and side of your shoulder, along with the armpit.   2 days before ____ AM   ____ PM              1 day before ____ AM   ____ PM                         Do this twice a day for two days.  (Last application is the night before surgery, AFTER using the CHG soap as described below).  Do NOT apply benzoyl peroxide gel on the day of surgery.   Incentive Spirometer  An incentive spirometer is a tool that can help keep your lungs clear and active. This tool measures how well you are filling your lungs with each breath. Taking long deep breaths may help reverse or decrease the chance of developing breathing (pulmonary) problems (especially infection) following: A long period of time when you are unable to move or be active. BEFORE THE PROCEDURE  If the spirometer includes an indicator to show your best effort, your nurse or respiratory therapist will set it to a desired goal. If possible, sit up straight or lean slightly forward. Try not to slouch. Hold the incentive spirometer in an upright position. INSTRUCTIONS FOR USE  Sit on the edge of your bed if possible, or sit up as far as you can in bed or on a chair.  Hold the incentive spirometer in an upright position. Breathe out normally. Place the mouthpiece in your mouth and seal your lips tightly around it. Breathe in slowly and as deeply as possible, raising the piston or the ball toward the top of the column. Hold your breath for 3-5 seconds or for as long as possible. Allow the piston or ball to fall to the bottom of the column. Remove the mouthpiece from your mouth and breathe out normally. Rest for a few seconds and repeat Steps 1 through 7 at least 10 times every 1-2  hours when you are awake. Take your time and take a few normal breaths between deep breaths. The spirometer may include an indicator to show your best effort. Use the indicator as a goal to work toward during each repetition. After each set of 10 deep breaths, practice coughing to be sure your lungs are clear. If you have an incision (the cut made at the time of surgery), support your incision when coughing by placing a pillow or rolled up towels firmly against it. Once you are able to get out of bed, walk around indoors and cough well. You may stop using the incentive spirometer when instructed by your caregiver.  RISKS AND COMPLICATIONS Take your time so you do not get dizzy or light-headed. If you are in pain, you may need to take or ask for pain medication before doing incentive spirometry. It is harder to take a deep breath if you are having pain. AFTER USE Rest and breathe slowly and easily. It can be helpful to keep track of a log of your progress. Your caregiver can provide you with a simple table to help with this. If you are using the spirometer at home, follow these instructions: [place] IF:  You are having difficultly using the spirometer. You have trouble using the spirometer as often as instructed. Your pain medication is not giving enough relief while using the spirometer. You develop fever of 100.5 F (38.1 C) or higher. SEEK IMMEDIATE MEDICAL CARE IF:  You cough up bloody sputum that had not been present before. You develop fever of 102 F (38.9 C) or greater. You develop worsening pain at or near the incision site. MAKE SURE YOU:  Understand these instructions. Will watch your condition. Will get help right away if you are not doing well or get worse. Document Released: 07/08/2006 Document Revised: 05/20/2011 Document Reviewed: 09/08/2006 [facility] Patient Information 2014 [place],  [place].   ________________________________________________________________________

## 2021-02-09 ENCOUNTER — Encounter (HOSPITAL_COMMUNITY)
Admission: RE | Admit: 2021-02-09 | Discharge: 2021-02-09 | Disposition: A | Discharge status: Home / Self Care | Payer: Medicare Other | Source: Ambulatory Visit | Attending: Orthopedic Surgery | Admitting: Orthopedic Surgery

## 2021-02-09 ENCOUNTER — Encounter (HOSPITAL_COMMUNITY): Payer: Self-pay

## 2021-02-09 ENCOUNTER — Other Ambulatory Visit: Payer: Self-pay

## 2021-02-09 VITALS — BP 148/102 | HR 73 | Temp 98.3°F | Ht 71.0 in | Wt 203.0 lb

## 2021-02-09 DIAGNOSIS — I10 Essential (primary) hypertension: Secondary | ICD-10-CM | POA: Insufficient documentation

## 2021-02-09 DIAGNOSIS — Z01812 Encounter for preprocedural laboratory examination: Secondary | ICD-10-CM | POA: Diagnosis present

## 2021-02-09 DIAGNOSIS — R7303 Prediabetes: Secondary | ICD-10-CM | POA: Diagnosis not present

## 2021-02-09 DIAGNOSIS — Z01818 Encounter for other preprocedural examination: Secondary | ICD-10-CM

## 2021-02-09 HISTORY — DX: Prediabetes: R73.03

## 2021-02-09 LAB — BASIC METABOLIC PANEL
Anion gap: 10 (ref 5–15)
BUN: 28 mg/dL — ABNORMAL HIGH (ref 8–23)
CO2: 26 mmol/L (ref 22–32)
Calcium: 9.5 mg/dL (ref 8.9–10.3)
Chloride: 100 mmol/L (ref 98–111)
Creatinine, Ser: 1.17 mg/dL (ref 0.61–1.24)
GFR, Estimated: >60 mL/min (ref >60)
Glucose, Bld: 103 mg/dL — ABNORMAL HIGH (ref 70–99)
Potassium: 4.7 mmol/L (ref 3.5–5.1)
Sodium: 136 mmol/L (ref 135–145)

## 2021-02-09 LAB — HEMOGLOBIN A1C
Hgb A1c MFr Bld: 5.7 % — ABNORMAL HIGH (ref 4.8–5.6)
Mean Plasma Glucose: 116.89 mg/dL

## 2021-02-09 LAB — CBC
HCT: 40.1 % (ref 39.0–52.0)
Hemoglobin: 13.4 g/dL (ref 13.0–17.0)
MCH: 34 pg (ref 26.0–34.0)
MCHC: 33.4 g/dL (ref 30.0–36.0)
MCV: 101.8 fL — ABNORMAL HIGH (ref 80.0–100.0)
Platelets: 239 10*3/uL (ref 150–400)
RBC: 3.94 MIL/uL — ABNORMAL LOW (ref 4.22–5.81)
RDW: 13.1 % (ref 11.5–15.5)
WBC: 9.9 10*3/uL (ref 4.0–10.5)
nRBC: 0 % (ref 0.0–0.2)

## 2021-02-09 LAB — SURGICAL PCR SCREEN
MRSA, PCR: NEGATIVE
Staphylococcus aureus: NEGATIVE

## 2021-02-09 LAB — GLUCOSE, CAPILLARY: Glucose-Capillary: 105 mg/dL — ABNORMAL HIGH (ref 70–99)

## 2021-02-09 NOTE — Progress Notes (Signed)
COVID Vaccine Completed: Yes Date COVID Vaccine completed: 06/06/20 x 2 COVID vaccine manufacturer: [place] Test: 02/21/21  PCP - Ammie Dalton: PAC Cardiologist - Skeet Latch. LOV: 12/25/20  Chest x-ray - 10/16/20 EKG - 12/25/20 Stress Test -  ECHO -  Cardiac Cath -  Pacemaker/ICD device last checked:  Sleep Study -  CPAP -   Fasting Blood Sugar -  Checks Blood Sugar _____ times a day  Blood Thinner Instructions: Aspirin Instructions: Last Dose:  Anesthesia review: HTN,Second degree AV block  Patient denies shortness of breath, fever, cough and chest pain at PAT appointment   Patient verbalized understanding of instructions that were given to them at the PAT appointment. Patient was also instructed that they will need to review over the PAT instructions again at home before surgery.

## 2021-02-15 ENCOUNTER — Telehealth: Payer: Self-pay | Admitting: *Deleted

## 2021-02-15 ENCOUNTER — Encounter (HOSPITAL_COMMUNITY): Payer: Self-pay | Admitting: Physician Assistant

## 2021-02-15 NOTE — Telephone Encounter (Signed)
Patient notified of sleep study appointment details.

## 2021-02-15 NOTE — Progress Notes (Signed)
Anesthesia Chart Review   Case: [id number] Date/Time: 02/23/21 1222   Procedure: TOTAL SHOULDER ARTHROPLASTY (Left: Shoulder) - with ISB   Anesthesia type: General   Pre-op diagnosis: Left shoulder endstage osteoarthritis   Location: [facility] ROOM 06 / [facility]   Surgeons: Netta Cedars, MD       DISCUSSION:65 y.o. former smoker with h/o HTN, GERD, pre-diabetes, left shoulder OA scheduled for above procedure 02/23/2021 with Dr. Netta Cedars.   Pt seen by cardiology 12/25/2020 for evaluation of abnormal EKG at PAT, EKG with 2nd degree AV block, Mobitz type 1.  Per OV note, "He is here today for surgical assessment.  No plans for pacemaker implantation.  We will check a sleep study and recommend that anesthesia be ready with a beta agonist in case he does become bradycardic with induction of anesthesia.  However I suspect that he will do fine.  He has no evidence of any structural heart disease or heart failure so no echo indicated at this time."  Anticipate pt can proceed with planned procedure barring acute status change.   VS: BP (!) 148/102   Pulse 73   Temp 36.8 C (Oral)   Ht 5\' 11"  (1.803 m)   Wt 92.1 kg   SpO2 98%   BMI 28.31 kg/m   PROVIDERS: Ramiro Harvest, PA-C is PCP   Skeet Latch, MD is Cardiologist  LABS: Labs reviewed: Acceptable for surgery. (all labs ordered are listed, but only abnormal results are displayed)  Labs Reviewed  CBC - Abnormal; Notable for the following components:      Result Value   RBC 3.94 (*)    MCV 101.8 (*)    All other components within normal limits  BASIC METABOLIC PANEL - Abnormal; Notable for the following components:   Glucose, Bld 103 (*)    BUN 28 (*)    All other components within normal limits  GLUCOSE, CAPILLARY - Abnormal; Notable for the following components:   Glucose-Capillary 105 (*)    All other components within normal limits  HEMOGLOBIN A1C - Abnormal; Notable for the following components:   Hgb A1c MFr Bld 5.7 (*)     All other components within normal limits  SURGICAL PCR SCREEN     IMAGES:   EKG: 12/25/2020 Rate 51 bpm  Sinus rhythm with 2nd degree AV block  Mobitz 1 with 2:1 AV conduction  CV:  Past Medical History:  Diagnosis Date   Arthritis    Essential hypertension 12/25/2020   GERD (gastroesophageal reflux disease)    Hypertension    Pre-diabetes    Pure hypercholesterolemia 12/25/2020   Second degree atrioventricular block, Mobitz (type) I 12/25/2020    Past Surgical History:  Procedure Laterality Date   ARTHROSCOPY KNEE W/ DRILLING Left    KNEE ARTHROSCOPY     WISDOM TOOTH EXTRACTION      MEDICATIONS:  acetaminophen (TYLENOL) 500 MG tablet   atorvastatin (LIPITOR) 80 MG tablet   Coenzyme Q10 (CO Q-10) 100 MG CAPS   lisinopril-hydrochlorothiazide (ZESTORETIC) 20-25 MG tablet   Multiple Vitamin (MULTIVITAMIN WITH MINERALS) TABS tablet   omeprazole (PRILOSEC) 40 MG capsule   No current facility-administered medications for this encounter.    Konrad Felix Ward, PA-C [facility] Pre-Surgical Testing [phone]

## 2021-02-15 NOTE — Telephone Encounter (Signed)
-----   Message from Earvin Hansen, LPN sent at 79/07/5829 12:11 PM EST ----- Hello all  Patient needs sleep study  Thanks  Rip Harbour

## 2021-02-22 ENCOUNTER — Other Ambulatory Visit: Payer: Self-pay | Admitting: *Deleted

## 2021-02-22 LAB — SARS CORONAVIRUS 2 (TAT 6-24 HRS): SARS Coronavirus 2: POSITIVE — AB

## 2021-03-13 ENCOUNTER — Ambulatory Visit (HOSPITAL_BASED_OUTPATIENT_CLINIC_OR_DEPARTMENT_OTHER): Payer: Medicare Other | Attending: Cardiovascular Disease | Admitting: Cardiovascular Disease

## 2021-03-13 ENCOUNTER — Other Ambulatory Visit: Payer: Self-pay

## 2021-03-13 DIAGNOSIS — G4733 Obstructive sleep apnea (adult) (pediatric): Secondary | ICD-10-CM | POA: Diagnosis not present

## 2021-03-13 DIAGNOSIS — I441 Atrioventricular block, second degree: Secondary | ICD-10-CM

## 2021-03-13 DIAGNOSIS — R0681 Apnea, not elsewhere classified: Secondary | ICD-10-CM | POA: Diagnosis not present

## 2021-03-13 DIAGNOSIS — R0683 Snoring: Secondary | ICD-10-CM | POA: Diagnosis present

## 2021-03-13 DIAGNOSIS — G4730 Sleep apnea, unspecified: Secondary | ICD-10-CM | POA: Diagnosis not present

## 2021-03-15 NOTE — Patient Instructions (Signed)
DUE TO COVID-19 ONLY ONE VISITOR IS ALLOWED TO COME WITH YOU AND STAY IN THE WAITING ROOM ONLY DURING PRE OP AND PROCEDURE.   **NO VISITORS ARE ALLOWED IN THE SHORT STAY AREA OR RECOVERY ROOM!!**   IF YOU WILL BE ADMITTED INTO THE HOSPITAL YOU ARE ALLOWED ONLY TWO SUPPORT PEOPLE DURING VISITATION HOURS ONLY (7 AM -8PM)   The support person(s) must pass our screening, gel in and out, and wear a mask at all times, including in the patients room. Patients must also wear a mask when staff or their support person are in the room. Visitors GUEST BADGE MUST BE WORN VISIBLY  One adult visitor may remain with you overnight and MUST be in the room by 8 P.M.   No visitors under the age of 40. Any visitor under the age of 50 must be accompanied by an adult.                    Your procedure is scheduled on:  Friday, Jan. 6, 2023               Report to [facility] Main Entrance               Report to admitting at : 9:45 AM (This is the updated time of arrival since we spoke on the phone!!!)               Call this number if you have problems the morning of surgery [phone]               Do not eat food :After Midnight.               May have liquids until : 9:30 AM   day of surgery   CLEAR LIQUID DIET   Foods Allowed                                                                     Foods Excluded   Water, Black Coffee and tea, regular and decaf                             liquids that you cannot  Plain Jell-O in any flavor  (No red)                                           see through such as: Fruit ices (not with fruit pulp)                                     milk, soups, orange juice              Iced Popsicles (No red)                                           All solid food  Apple juices Sports drinks like Gatorade (No red) Lightly seasoned clear broth or consume(fat free) Sugar   Sample Menu Breakfast                                 Lunch                                     Supper Cranberry juice                    Beef broth                            Chicken broth Jell-O                                     Grape juice                           Apple juice Coffee or tea                        Jell-O                                      Popsicle                                                Coffee or tea                        Coffee or tea                             Complete one Ensure drink the morning of surgery at: 9:30 AM       the day of surgery.                          The day of surgery:  Drink ONE (1) Pre-Surgery Clear Ensure at 9:30 AM the morning of surgery. Drink in one sitting. Do not sip.  This drink was given to you during your hospital  pre-op appointment visit. Nothing else to drink after completing the  Pre-Surgery Clear Ensure.          If you have questions, please contact your surgeons office.     Oral Hygiene is also important to reduce your risk of infection.                                    Remember - BRUSH YOUR TEETH THE MORNING OF SURGERY WITH YOUR REGULAR TOOTHPASTE               Do NOT smoke after Midnight               Take these medicines the morning of surgery with A SIP OF  WATER: Omeprazole.                              You may not have any metal on your body including jewelry, and body piercing              Do not wear lotions, powders, perfumes/cologne, or deodorant               Men may shave face and neck.               Do not bring valuables to the hospital. [place].               Contacts, dentures or bridgework may not be worn into surgery.               Bring small overnight bag day of surgery.                           Special Instructions: Bring a copy of your healthcare power of attorney and living will documents         the day of surgery if you haven't scanned them before.               Please read over  the following fact sheets you were given: IF YOU HAVE QUESTIONS ABOUT YOUR PRE-OP INSTRUCTIONS PLEASE CALL [place]- Preparing for Total Shoulder Arthroplasty    Before surgery, you can play an important role. Because skin is not sterile, your skin needs to be as free of germs as possible. You can reduce the number of germs on your skin by using the following products. Benzoyl Peroxide Gel Reduces the number of germs present on the skin Applied twice a day to shoulder area starting two days before surgery    ==================================================================  Please follow these instructions carefully:  BENZOYL PEROXIDE 5% GEL  Please do not use if you have an allergy to benzoyl peroxide.   If your skin becomes reddened/irritated stop using the benzoyl peroxide.  Starting two days before surgery, apply as follows: Use Gel twice today!!!!! Apply benzoyl peroxide in the morning and at night. Apply after taking a shower. If you are not taking a shower clean entire shoulder front, back, and side along with the armpit with a clean wet washcloth.  Place a quarter-sized dollop on your shoulder and rub in thoroughly, making sure to cover the front, back, and side of your shoulder, along with the armpit.   2 days before ____ AM   ____ PM              1 day before ____ AM   ____ PM                         Do this twice a day for two days.  (Last application is the night before surgery, AFTER using the CHG soap as described below).  Do NOT apply benzoyl peroxide gel on the day of surgery.    [facility] - Preparing for Surgery Before surgery, you can play an important role.  Because skin is not sterile, your skin needs to be as free of germs as possible.  You can reduce the number of germs on your skin by washing  with CHG (chlorahexidine gluconate) soap before surgery.  CHG is an antiseptic cleaner which kills germs and bonds with the skin to continue killing germs  even after washing. Please DO NOT use if you have an allergy to CHG or antibacterial soaps.  If your skin becomes reddened/irritated stop using the CHG and inform your nurse when you arrive at Short Stay. Do not shave (including legs and underarms) for at least 48 hours prior to the first CHG shower.  You may shave your face/neck.  Please follow these instructions carefully:  1.  Shower with CHG Soap the night before surgery and the  morning of surgery.  2.  If you choose to wash your hair, wash your hair first as usual with your normal  shampoo.  3.  After you shampoo, rinse your hair and body thoroughly to remove the shampoo.                             4.  Use CHG as you would any other liquid soap.  You can apply chg directly to the skin and wash.  Gently with a scrungie or clean washcloth.  5.  Apply the CHG Soap to your body ONLY FROM THE NECK DOWN.   Do   not use on face/ open                           Wound or open sores. Avoid contact with eyes, ears mouth and   genitals (private parts).                       Wash face,  Genitals (private parts) with your normal soap.             6.  Wash thoroughly, paying special attention to the area where your    surgery  will be performed.  7.  Thoroughly rinse your body with warm water from the neck down.  8.  DO NOT shower/wash with your normal soap after using and rinsing off the CHG Soap.                9.  Pat yourself dry with a clean towel.            10.  Wear clean pajamas.            11.  Place clean sheets on your bed the night of your first shower and do not  sleep with pets. Day of Surgery : Do not apply any lotions/deodorants the morning of surgery.  Please wear clean clothes to the hospital/surgery center.  FAILURE TO FOLLOW THESE INSTRUCTIONS MAY RESULT IN THE CANCELLATION OF YOUR SURGERY  PATIENT SIGNATURE_________________________________  NURSE  SIGNATURE__________________________________  ________________________________________________________________________     Glen Carrillo  An incentive spirometer is a tool that can help keep your lungs clear and active. This tool measures how well you are filling your lungs with each breath. Taking long deep breaths may help reverse or decrease the chance of developing breathing (pulmonary) problems (especially infection) following: A long period of time when you are unable to move or be active. BEFORE THE PROCEDURE  If the spirometer includes an indicator to show your best effort, your nurse or respiratory therapist will set it to a desired goal. If possible, sit up straight or lean slightly forward. Try not to slouch. Hold the incentive spirometer in an upright  position. INSTRUCTIONS FOR USE  Sit on the edge of your bed if possible, or sit up as far as you can in bed or on a chair. Hold the incentive spirometer in an upright position. Breathe out normally. Place the mouthpiece in your mouth and seal your lips tightly around it. Breathe in slowly and as deeply as possible, raising the piston or the ball toward the top of the column. Hold your breath for 3-5 seconds or for as long as possible. Allow the piston or ball to fall to the bottom of the column. Remove the mouthpiece from your mouth and breathe out normally. Rest for a few seconds and repeat Steps 1 through 7 at least 10 times every 1-2 hours when you are awake. Take your time and take a few normal breaths between deep breaths. The spirometer may include an indicator to show your best effort. Use the indicator as a goal to work toward during each repetition. After each set of 10 deep breaths, practice coughing to be sure your lungs are clear. If you have an incision (the cut made at the time of surgery), support your incision when coughing by placing a pillow or rolled up towels firmly against it. Once you are able to get out  of bed, walk around indoors and cough well. You may stop using the incentive spirometer when instructed by your caregiver.  RISKS AND COMPLICATIONS Take your time so you do not get dizzy or light-headed. If you are in pain, you may need to take or ask for pain medication before doing incentive spirometry. It is harder to take a deep breath if you are having pain. AFTER USE Rest and breathe slowly and easily. It can be helpful to keep track of a log of your progress. Your caregiver can provide you with a simple table to help with this. If you are using the spirometer at home, follow these instructions: [place] IF:  You are having difficultly using the spirometer. You have trouble using the spirometer as often as instructed. Your pain medication is not giving enough relief while using the spirometer. You develop fever of 100.5 F (38.1 C) or higher. SEEK IMMEDIATE MEDICAL CARE IF:  You cough up bloody sputum that had not been present before. You develop fever of 102 F (38.9 C) or greater. You develop worsening pain at or near the incision site. MAKE SURE YOU:  Understand these instructions. Will watch your condition. Will get help right away if you are not doing well or get worse. Document Released: 07/08/2006 Document Revised: 05/20/2011 Document Reviewed: 09/08/2006 [facility] Patient Information 2014 [place], [place].   ________________________________________________________________________

## 2021-03-15 NOTE — H&P (Signed)
Patient's anticipated LOS is less than 2 midnights, meeting these requirements: - Younger than 24 - Lives within 1 hour of care - Has a competent adult at home to recover with post-op recover - NO history of  - Chronic pain requiring opiods  - Diabetes  - Coronary Artery Disease  - Heart failure  - Heart attack  - Stroke  - DVT/VTE  - Cardiac arrhythmia  - Respiratory Failure/COPD  - Renal failure  - Anemia  - Advanced Liver disease     Glen Carrillo is an 66 y.o. male.    Chief Complaint: left shoulder pain  HPI: Pt is a 66 y.o. male complaining of left shoulder pain for multiple years. Pain had continually increased since the beginning. X-rays in the clinic show end-stage arthritic changes of the left shoulder. Pt has tried various conservative treatments which have failed to alleviate their symptoms, including injections and therapy. Various options are discussed with the patient. Risks, benefits and expectations were discussed with the patient. Patient understand the risks, benefits and expectations and wishes to proceed with surgery.   PCP:  Ramiro Harvest, PA-C  D/C Plans: Home  PMH: Past Medical History:  Diagnosis Date   Arthritis    Essential hypertension 12/25/2020   GERD (gastroesophageal reflux disease)    Hypertension    Pre-diabetes    Pure hypercholesterolemia 12/25/2020   Second degree atrioventricular block, Mobitz (type) I 12/25/2020    PSH: Past Surgical History:  Procedure Laterality Date   ARTHROSCOPY KNEE W/ DRILLING Left    KNEE ARTHROSCOPY     WISDOM TOOTH EXTRACTION      Social History:  reports that he has quit smoking. His smoking use included cigarettes. He has never used smokeless tobacco. He reports current alcohol use. He reports that he does not use drugs.  Allergies:  Allergies  Allergen Reactions   Metformin And Related     swelling    Medications: No current facility-administered medications for this encounter.    Current Outpatient Medications  Medication Sig Dispense Refill   acetaminophen (TYLENOL) 500 MG tablet Take 1,000 mg by mouth every 6 (six) hours as needed for moderate pain.     atorvastatin (LIPITOR) 80 MG tablet Take 80 mg by mouth daily.     Coenzyme Q10 (CO Q-10) 100 MG CAPS Take 100 mg by mouth daily.     lisinopril-hydrochlorothiazide (ZESTORETIC) 20-25 MG tablet Take 1 tablet by mouth daily.     Multiple Vitamin (MULTIVITAMIN WITH MINERALS) TABS tablet Take 1 tablet by mouth daily.     omeprazole (PRILOSEC) 40 MG capsule Take 40 mg by mouth daily.      No results found for this or any previous visit (from the past 48 hour(s)). SLEEP STUDY DOCUMENTS  Result Date: 03/14/2021 Ordered by an unspecified provider.   ROS: Pain with rom of the left upper extremity  Physical Exam: Alert and oriented 66 y.o. male in no acute distress Cranial nerves 2-12 intact Cervical spine: full rom with no tenderness, nv intact distally Chest: active breath sounds bilaterally, no wheeze rhonchi or rales Heart: regular rate and rhythm, no murmur Abd: non tender non distended with active bowel sounds Hip is stable with rom  Left shoulder painful rom with crepitus Strength of ER and IR 4.5/5  No rashes or edema distally  Assessment/Plan Assessment: left shoulder end stage osteoarthritis  Plan:  Patient will undergo a left total shoulder by Dr. Veverly Fells at [place] Risks benefits and expectations were  discussed with the patient. Patient understand risks, benefits and expectations and wishes to proceed. Preoperative templating of the joint replacement has been completed, documented, and submitted to the Operating Room personnel in order to optimize intra-operative equipment management.   Merla Riches PA-C, MPAS [facility] is now [employer] [address] [place], [place], [place] [address] Phone: [phone] [email or web address] Facebook   [employer]

## 2021-03-15 NOTE — Progress Notes (Signed)
Glen Carrillo stated no symptoms after positive COVID test 02/22/2021.  No changes in allergies, medications or medical history at this time. Made aware to arrive at 9:45 AM reviewed pre op instructions and sent them to patient via email.

## 2021-03-16 ENCOUNTER — Ambulatory Visit (HOSPITAL_COMMUNITY): Payer: Medicare Other | Admitting: Anesthesiology

## 2021-03-16 ENCOUNTER — Observation Stay (HOSPITAL_COMMUNITY)
Admission: RE | Admit: 2021-03-16 | Discharge: 2021-03-17 | Disposition: A | Discharge status: Home / Self Care | Payer: Medicare Other | Attending: Orthopedic Surgery | Admitting: Orthopedic Surgery

## 2021-03-16 ENCOUNTER — Encounter (HOSPITAL_COMMUNITY)
Admission: RE | Disposition: A | Discharge status: Home / Self Care | Payer: Self-pay | Source: Home / Self Care | Attending: Orthopedic Surgery

## 2021-03-16 ENCOUNTER — Other Ambulatory Visit: Payer: Self-pay

## 2021-03-16 ENCOUNTER — Observation Stay (HOSPITAL_COMMUNITY): Payer: Medicare Other

## 2021-03-16 ENCOUNTER — Encounter (HOSPITAL_COMMUNITY): Payer: Self-pay | Admitting: Orthopedic Surgery

## 2021-03-16 DIAGNOSIS — Z79899 Other long term (current) drug therapy: Secondary | ICD-10-CM | POA: Insufficient documentation

## 2021-03-16 DIAGNOSIS — M19012 Primary osteoarthritis, left shoulder: Principal | ICD-10-CM | POA: Insufficient documentation

## 2021-03-16 DIAGNOSIS — Z87891 Personal history of nicotine dependence: Secondary | ICD-10-CM | POA: Diagnosis not present

## 2021-03-16 DIAGNOSIS — I10 Essential (primary) hypertension: Secondary | ICD-10-CM | POA: Diagnosis not present

## 2021-03-16 DIAGNOSIS — Z01818 Encounter for other preprocedural examination: Secondary | ICD-10-CM

## 2021-03-16 DIAGNOSIS — Z96612 Presence of left artificial shoulder joint: Secondary | ICD-10-CM

## 2021-03-16 HISTORY — PX: TOTAL SHOULDER ARTHROPLASTY: SHX126

## 2021-03-16 LAB — BASIC METABOLIC PANEL
Anion gap: 12 (ref 5–15)
BUN: 30 mg/dL — ABNORMAL HIGH (ref 8–23)
CO2: 21 mmol/L — ABNORMAL LOW (ref 22–32)
Calcium: 9.7 mg/dL (ref 8.9–10.3)
Chloride: 105 mmol/L (ref 98–111)
Creatinine, Ser: 1.47 mg/dL — ABNORMAL HIGH (ref 0.61–1.24)
GFR, Estimated: 52 mL/min — ABNORMAL LOW (ref >60)
Glucose, Bld: 104 mg/dL — ABNORMAL HIGH (ref 70–99)
Potassium: 4.5 mmol/L (ref 3.5–5.1)
Sodium: 138 mmol/L (ref 135–145)

## 2021-03-16 LAB — CBC
HCT: 39.4 % (ref 39.0–52.0)
Hemoglobin: 13.7 g/dL (ref 13.0–17.0)
MCH: 33.4 pg (ref 26.0–34.0)
MCHC: 34.8 g/dL (ref 30.0–36.0)
MCV: 96.1 fL (ref 80.0–100.0)
Platelets: 230 10*3/uL (ref 150–400)
RBC: 4.1 MIL/uL — ABNORMAL LOW (ref 4.22–5.81)
RDW: 11.5 % (ref 11.5–15.5)
WBC: 9.6 10*3/uL (ref 4.0–10.5)
nRBC: 0 % (ref 0.0–0.2)

## 2021-03-16 SURGERY — ARTHROPLASTY, SHOULDER, TOTAL
Anesthesia: Regional | Site: Shoulder | Laterality: Left

## 2021-03-16 MED ORDER — ADULT MULTIVITAMIN W/MINERALS CH
1.0000 | ORAL_TABLET | Freq: Every day | ORAL | Status: DC
  Administered 2021-03-17: 1 via ORAL
  Filled 2021-03-16: qty 1

## 2021-03-16 MED ORDER — LIDOCAINE 2% (20 MG/ML) 5 ML SYRINGE
INTRAMUSCULAR | Status: DC | PRN
  Administered 2021-03-16: 80 mg via INTRAVENOUS

## 2021-03-16 MED ORDER — CEFAZOLIN SODIUM-DEXTROSE 2-4 GM/100ML-% IV SOLN
2.0000 g | Freq: Four times a day (QID) | INTRAVENOUS | Status: AC
  Administered 2021-03-16 – 2021-03-17 (×3): 2 g via INTRAVENOUS
  Filled 2021-03-16 (×3): qty 100

## 2021-03-16 MED ORDER — DOCUSATE SODIUM 100 MG PO CAPS
100.0000 mg | ORAL_CAPSULE | Freq: Two times a day (BID) | ORAL | Status: DC
  Administered 2021-03-16 – 2021-03-17 (×2): 100 mg via ORAL
  Filled 2021-03-16 (×2): qty 1

## 2021-03-16 MED ORDER — SODIUM CHLORIDE 0.9 % IV SOLN: INTRAVENOUS | Status: DC

## 2021-03-16 MED ORDER — BISACODYL 10 MG RE SUPP: 10.0000 mg | Freq: Every day | RECTAL | Status: DC | PRN

## 2021-03-16 MED ORDER — ORAL CARE MOUTH RINSE: 15.0000 mL | Freq: Once | OROMUCOSAL | Status: AC

## 2021-03-16 MED ORDER — ONDANSETRON HCL 4 MG PO TABS: 4.0000 mg | ORAL_TABLET | Freq: Three times a day (TID) | ORAL | 1 refills | Status: AC | PRN

## 2021-03-16 MED ORDER — PHENYLEPHRINE HCL-NACL 20-0.9 MG/250ML-% IV SOLN
INTRAVENOUS | Status: DC | PRN
  Administered 2021-03-16: 35 ug/min via INTRAVENOUS

## 2021-03-16 MED ORDER — FENTANYL CITRATE PF 50 MCG/ML IJ SOSY
PREFILLED_SYRINGE | INTRAMUSCULAR | Status: AC
  Administered 2021-03-16: 50 ug via INTRAVENOUS
  Filled 2021-03-16: qty 1

## 2021-03-16 MED ORDER — FENTANYL CITRATE PF 50 MCG/ML IJ SOSY
25.0000 ug | PREFILLED_SYRINGE | INTRAMUSCULAR | Status: DC | PRN
  Administered 2021-03-16: 50 ug via INTRAVENOUS

## 2021-03-16 MED ORDER — PANTOPRAZOLE SODIUM 40 MG PO TBEC
40.0000 mg | DELAYED_RELEASE_TABLET | Freq: Every day | ORAL | Status: DC
  Administered 2021-03-17: 40 mg via ORAL
  Filled 2021-03-16: qty 1

## 2021-03-16 MED ORDER — ONDANSETRON HCL 4 MG/2ML IJ SOLN
INTRAMUSCULAR | Status: DC | PRN
Start: 2021-03-16 — End: 2021-03-16
  Administered 2021-03-16: 4 mg via INTRAVENOUS

## 2021-03-16 MED ORDER — CEFAZOLIN SODIUM-DEXTROSE 2-4 GM/100ML-% IV SOLN
2.0000 g | INTRAVENOUS | Status: AC
  Administered 2021-03-16: 2 g via INTRAVENOUS
  Filled 2021-03-16: qty 100

## 2021-03-16 MED ORDER — ONDANSETRON HCL 4 MG PO TABS: 4.0000 mg | ORAL_TABLET | Freq: Four times a day (QID) | ORAL | Status: DC | PRN

## 2021-03-16 MED ORDER — FENTANYL CITRATE PF 50 MCG/ML IJ SOSY
PREFILLED_SYRINGE | INTRAMUSCULAR | Status: AC
  Filled 2021-03-16: qty 1

## 2021-03-16 MED ORDER — MENTHOL 3 MG MT LOZG: 1.0000 | LOZENGE | OROMUCOSAL | Status: DC | PRN

## 2021-03-16 MED ORDER — ONDANSETRON HCL 4 MG/2ML IJ SOLN
INTRAMUSCULAR | Status: AC
  Filled 2021-03-16: qty 2

## 2021-03-16 MED ORDER — OXYCODONE HCL 5 MG PO TABS
5.0000 mg | ORAL_TABLET | ORAL | Status: DC | PRN
  Administered 2021-03-16 – 2021-03-17 (×3): 10 mg via ORAL
  Filled 2021-03-16 (×3): qty 2

## 2021-03-16 MED ORDER — BUPIVACAINE HCL (PF) 0.5 % IJ SOLN
INTRAMUSCULAR | Status: DC | PRN
Start: 2021-03-16 — End: 2021-03-16
  Administered 2021-03-16: 15 mL via PERINEURAL

## 2021-03-16 MED ORDER — HYDROCHLOROTHIAZIDE 25 MG PO TABS
25.0000 mg | ORAL_TABLET | Freq: Every day | ORAL | Status: DC
  Filled 2021-03-16: qty 1

## 2021-03-16 MED ORDER — 0.9 % SODIUM CHLORIDE (POUR BTL) OPTIME
TOPICAL | Status: DC | PRN
  Administered 2021-03-16: 1000 mL

## 2021-03-16 MED ORDER — POLYETHYLENE GLYCOL 3350 17 G PO PACK: 17.0000 g | PACK | Freq: Every day | ORAL | Status: DC | PRN

## 2021-03-16 MED ORDER — ROCURONIUM BROMIDE 10 MG/ML (PF) SYRINGE
PREFILLED_SYRINGE | INTRAVENOUS | Status: DC | PRN
  Administered 2021-03-16: 70 mg via INTRAVENOUS

## 2021-03-16 MED ORDER — THROMBIN (RECOMBINANT) 5000 UNITS EX SOLR
CUTANEOUS | Status: AC
  Filled 2021-03-16: qty 5000

## 2021-03-16 MED ORDER — LIDOCAINE HCL (PF) 2 % IJ SOLN
INTRAMUSCULAR | Status: AC
  Filled 2021-03-16: qty 5

## 2021-03-16 MED ORDER — OXYCODONE-ACETAMINOPHEN 5-325 MG PO TABS
1.0000 | ORAL_TABLET | ORAL | 0 refills | Status: AC | PRN
Start: 2021-03-16 — End: 2022-03-16

## 2021-03-16 MED ORDER — CO Q-10 100 MG PO CAPS: 100.0000 mg | ORAL_CAPSULE | Freq: Every day | ORAL | Status: DC

## 2021-03-16 MED ORDER — FENTANYL CITRATE PF 50 MCG/ML IJ SOSY: 50.0000 ug | PREFILLED_SYRINGE | INTRAMUSCULAR | Status: DC

## 2021-03-16 MED ORDER — THROMBIN 5000 UNITS EX SOLR
CUTANEOUS | Status: DC | PRN
  Administered 2021-03-16: 5000 [IU] via TOPICAL

## 2021-03-16 MED ORDER — METHOCARBAMOL 500 MG PO TABS
500.0000 mg | ORAL_TABLET | Freq: Four times a day (QID) | ORAL | Status: DC | PRN
  Administered 2021-03-16 – 2021-03-17 (×3): 500 mg via ORAL
  Filled 2021-03-16 (×3): qty 1

## 2021-03-16 MED ORDER — PROMETHAZINE HCL 25 MG/ML IJ SOLN: 6.2500 mg | INTRAMUSCULAR | Status: DC | PRN

## 2021-03-16 MED ORDER — FENTANYL CITRATE PF 50 MCG/ML IJ SOSY
PREFILLED_SYRINGE | INTRAMUSCULAR | Status: AC
  Administered 2021-03-16: 50 ug via INTRAVENOUS
  Filled 2021-03-16: qty 2

## 2021-03-16 MED ORDER — PHENYLEPHRINE 40 MCG/ML (10ML) SYRINGE FOR IV PUSH (FOR BLOOD PRESSURE SUPPORT)
PREFILLED_SYRINGE | INTRAVENOUS | Status: DC | PRN
  Administered 2021-03-16: 80 ug via INTRAVENOUS
  Administered 2021-03-16: 40 ug via INTRAVENOUS
  Administered 2021-03-16: 120 ug via INTRAVENOUS
  Administered 2021-03-16: 80 ug via INTRAVENOUS

## 2021-03-16 MED ORDER — LISINOPRIL 20 MG PO TABS
20.0000 mg | ORAL_TABLET | Freq: Every day | ORAL | Status: DC
  Administered 2021-03-17: 20 mg via ORAL
  Filled 2021-03-16: qty 1

## 2021-03-16 MED ORDER — FENTANYL CITRATE (PF) 100 MCG/2ML IJ SOLN
INTRAMUSCULAR | Status: AC
  Filled 2021-03-16: qty 2

## 2021-03-16 MED ORDER — ROCURONIUM BROMIDE 10 MG/ML (PF) SYRINGE
PREFILLED_SYRINGE | INTRAVENOUS | Status: AC
  Filled 2021-03-16: qty 10

## 2021-03-16 MED ORDER — METHOCARBAMOL 500 MG PO TABS: 500.0000 mg | ORAL_TABLET | Freq: Three times a day (TID) | ORAL | 1 refills | Status: DC | PRN

## 2021-03-16 MED ORDER — STERILE WATER FOR IRRIGATION IR SOLN
Status: DC | PRN
  Administered 2021-03-16 (×2): 1000 mL

## 2021-03-16 MED ORDER — SUGAMMADEX SODIUM 200 MG/2ML IV SOLN
INTRAVENOUS | Status: DC | PRN
  Administered 2021-03-16: 200 mg via INTRAVENOUS

## 2021-03-16 MED ORDER — DEXAMETHASONE SODIUM PHOSPHATE 10 MG/ML IJ SOLN
INTRAMUSCULAR | Status: DC | PRN
  Administered 2021-03-16: 10 mg via INTRAVENOUS

## 2021-03-16 MED ORDER — ESMOLOL HCL 100 MG/10ML IV SOLN
INTRAVENOUS | Status: AC
  Filled 2021-03-16: qty 10

## 2021-03-16 MED ORDER — BUPIVACAINE-EPINEPHRINE (PF) 0.25% -1:200000 IJ SOLN
INTRAMUSCULAR | Status: AC
  Filled 2021-03-16: qty 30

## 2021-03-16 MED ORDER — FENTANYL CITRATE (PF) 100 MCG/2ML IJ SOLN
INTRAMUSCULAR | Status: DC | PRN
  Administered 2021-03-16: 50 ug via INTRAVENOUS
  Administered 2021-03-16: 25 ug via INTRAVENOUS
  Administered 2021-03-16: 50 ug via INTRAVENOUS
  Administered 2021-03-16: 25 ug via INTRAVENOUS
  Administered 2021-03-16: 50 ug via INTRAVENOUS

## 2021-03-16 MED ORDER — BUPIVACAINE-EPINEPHRINE (PF) 0.25% -1:200000 IJ SOLN
INTRAMUSCULAR | Status: DC | PRN
  Administered 2021-03-16: 15 mL via PERINEURAL

## 2021-03-16 MED ORDER — HYDROMORPHONE HCL 1 MG/ML IJ SOLN: 0.5000 mg | INTRAMUSCULAR | Status: DC | PRN

## 2021-03-16 MED ORDER — AMISULPRIDE (ANTIEMETIC) 5 MG/2ML IV SOLN: 10.0000 mg | Freq: Once | INTRAVENOUS | Status: DC | PRN

## 2021-03-16 MED ORDER — DEXAMETHASONE SODIUM PHOSPHATE 10 MG/ML IJ SOLN
INTRAMUSCULAR | Status: AC
  Filled 2021-03-16: qty 1

## 2021-03-16 MED ORDER — PROPOFOL 10 MG/ML IV BOLUS
INTRAVENOUS | Status: AC
  Filled 2021-03-16: qty 20

## 2021-03-16 MED ORDER — PHENOL 1.4 % MT LIQD: 1.0000 | OROMUCOSAL | Status: DC | PRN

## 2021-03-16 MED ORDER — METOCLOPRAMIDE HCL 5 MG/ML IJ SOLN: 5.0000 mg | Freq: Three times a day (TID) | INTRAMUSCULAR | Status: DC | PRN

## 2021-03-16 MED ORDER — EPHEDRINE SULFATE-NACL 50-0.9 MG/10ML-% IV SOSY
PREFILLED_SYRINGE | INTRAVENOUS | Status: DC | PRN
  Administered 2021-03-16: 1 mg via INTRAVENOUS

## 2021-03-16 MED ORDER — OXYCODONE HCL 5 MG PO TABS: 5.0000 mg | ORAL_TABLET | Freq: Once | ORAL | Status: DC | PRN

## 2021-03-16 MED ORDER — OXYCODONE HCL 5 MG/5ML PO SOLN: 5.0000 mg | Freq: Once | ORAL | Status: DC | PRN

## 2021-03-16 MED ORDER — CHLORHEXIDINE GLUCONATE 0.12 % MT SOLN
15.0000 mL | Freq: Once | OROMUCOSAL | Status: AC
  Administered 2021-03-16: 15 mL via OROMUCOSAL

## 2021-03-16 MED ORDER — ONDANSETRON HCL 4 MG/2ML IJ SOLN: 4.0000 mg | Freq: Four times a day (QID) | INTRAMUSCULAR | Status: DC | PRN

## 2021-03-16 MED ORDER — ACETAMINOPHEN 500 MG PO TABS
1000.0000 mg | ORAL_TABLET | Freq: Once | ORAL | Status: AC
  Administered 2021-03-16: 500 mg via ORAL
  Filled 2021-03-16: qty 2

## 2021-03-16 MED ORDER — METOCLOPRAMIDE HCL 5 MG PO TABS: 5.0000 mg | ORAL_TABLET | Freq: Three times a day (TID) | ORAL | Status: DC | PRN

## 2021-03-16 MED ORDER — METHOCARBAMOL 1000 MG/10ML IJ SOLN
500.0000 mg | Freq: Four times a day (QID) | INTRAVENOUS | Status: DC | PRN
  Filled 2021-03-16: qty 5

## 2021-03-16 MED ORDER — ACETAMINOPHEN 325 MG PO TABS: 325.0000 mg | ORAL_TABLET | Freq: Four times a day (QID) | ORAL | Status: DC | PRN

## 2021-03-16 MED ORDER — LACTATED RINGERS IV SOLN: INTRAVENOUS | Status: DC

## 2021-03-16 MED ORDER — MIDAZOLAM HCL 2 MG/2ML IJ SOLN
1.0000 mg | INTRAMUSCULAR | Status: DC
  Filled 2021-03-16: qty 2

## 2021-03-16 MED ORDER — PROPOFOL 10 MG/ML IV BOLUS
INTRAVENOUS | Status: DC | PRN
  Administered 2021-03-16: 150 mg via INTRAVENOUS

## 2021-03-16 MED ORDER — ACETAMINOPHEN 500 MG PO TABS: 1000.0000 mg | ORAL_TABLET | Freq: Four times a day (QID) | ORAL | Status: DC | PRN

## 2021-03-16 MED ORDER — BUPIVACAINE LIPOSOME 1.3 % IJ SUSP
INTRAMUSCULAR | Status: DC | PRN
  Administered 2021-03-16: 10 mL via PERINEURAL

## 2021-03-16 MED ORDER — LISINOPRIL-HYDROCHLOROTHIAZIDE 20-25 MG PO TABS: 1.0000 | ORAL_TABLET | Freq: Every day | ORAL | Status: DC

## 2021-03-16 MED ORDER — FENTANYL CITRATE PF 50 MCG/ML IJ SOSY
50.0000 ug | PREFILLED_SYRINGE | INTRAMUSCULAR | Status: DC
  Filled 2021-03-16: qty 2

## 2021-03-16 MED ORDER — ATORVASTATIN CALCIUM 40 MG PO TABS
80.0000 mg | ORAL_TABLET | Freq: Every day | ORAL | Status: DC
  Administered 2021-03-16: 80 mg via ORAL
  Filled 2021-03-16: qty 2

## 2021-03-16 SURGICAL SUPPLY — 55 items
BAG COUNTER SPONGE SURGICOUNT (BAG) IMPLANT
BAG ZIPLOCK 12X15 (MISCELLANEOUS) IMPLANT
BIT DRILL 1.6MX128 (BIT) ×2 IMPLANT
BLADE SAG 18X100X1.27 (BLADE) ×2 IMPLANT
BODY UNITE ANATOMIC SZ12 (Miscellaneous) ×1 IMPLANT
CEMENT HV SMART SET (Cement) ×2 IMPLANT
COVER BACK TABLE 60X90IN (DRAPES) ×2 IMPLANT
COVER SURGICAL LIGHT HANDLE (MISCELLANEOUS) ×2 IMPLANT
DECANTER SPIKE VIAL GLASS SM (MISCELLANEOUS) ×2 IMPLANT
DRAPE INCISE IOBAN 66X45 STRL (DRAPES) ×2 IMPLANT
DRAPE ORTHO SPLIT 77X108 STRL (DRAPES) ×4
DRAPE SHEET LG 3/4 BI-LAMINATE (DRAPES) ×2 IMPLANT
DRAPE SURG ORHT 6 SPLT 77X108 (DRAPES) ×2 IMPLANT
DRAPE U-SHAPE 47X51 STRL (DRAPES) ×2 IMPLANT
DRSG ADAPTIC 3X8 NADH LF (GAUZE/BANDAGES/DRESSINGS) ×2 IMPLANT
DRSG PAD ABDOMINAL 8X10 ST (GAUZE/BANDAGES/DRESSINGS) ×2 IMPLANT
DURAPREP 26ML APPLICATOR (WOUND CARE) ×2 IMPLANT
ELECT BLADE TIP CTD 4 INCH (ELECTRODE) ×2 IMPLANT
ELECT NDL TIP 2.8 STRL (NEEDLE) ×1 IMPLANT
ELECT NEEDLE TIP 2.8 STRL (NEEDLE) ×2 IMPLANT
ELECT REM PT RETURN 15FT ADLT (MISCELLANEOUS) ×2 IMPLANT
GAUZE SPONGE 4X4 12PLY STRL (GAUZE/BANDAGES/DRESSINGS) ×2 IMPLANT
GLENOID ANCHOR PEG CROSSLK 48 (Orthopedic Implant) ×1 IMPLANT
GLOVE SURG ORTHO LTX SZ7.5 (GLOVE) ×2 IMPLANT
GLOVE SURG ORTHO LTX SZ8.5 (GLOVE) ×2 IMPLANT
GLOVE SURG UNDER POLY LF SZ7.5 (GLOVE) ×2 IMPLANT
GLOVE SURG UNDER POLY LF SZ8.5 (GLOVE) ×2 IMPLANT
GOWN STRL REUS W/TWL XL LVL3 (GOWN DISPOSABLE) ×4 IMPLANT
HEAD HUMERAL ECCEN 52MX21M (Head) ×1 IMPLANT
KIT BASIN OR (CUSTOM PROCEDURE TRAY) ×2 IMPLANT
KIT TURNOVER KIT A (KITS) IMPLANT
MANIFOLD NEPTUNE II (INSTRUMENTS) ×2 IMPLANT
NDL MAYO CATGUT SZ4 TPR NDL (NEEDLE) ×1 IMPLANT
NEEDLE MAYO CATGUT SZ4 (NEEDLE) ×2 IMPLANT
PACK SHOULDER (CUSTOM PROCEDURE TRAY) ×2 IMPLANT
PASSER SUT SWANSON 36MM LOOP (INSTRUMENTS) IMPLANT
PIN METAGLENE 2.5 (PIN) ×1 IMPLANT
PROTECTOR NERVE ULNAR (MISCELLANEOUS) ×2 IMPLANT
RESTRAINT HEAD UNIVERSAL NS (MISCELLANEOUS) ×2 IMPLANT
SLING ARM FOAM STRAP LRG (SOFTGOODS) ×2 IMPLANT
SMARTMIX MINI TOWER (MISCELLANEOUS) ×2
SPONGE SURGIFOAM ABS GEL 12-7 (HEMOSTASIS) ×2 IMPLANT
STEM UNITE SZ12 (Stem) ×1 IMPLANT
STRIP CLOSURE SKIN 1/2X4 (GAUZE/BANDAGES/DRESSINGS) ×2 IMPLANT
SUCTION FRAZIER HANDLE 12FR (TUBING) ×1
SUCTION TUBE FRAZIER 12FR DISP (TUBING) ×1 IMPLANT
SUT FIBERWIRE #2 38 T-5 BLUE (SUTURE) ×8
SUT MNCRL AB 4-0 PS2 18 (SUTURE) ×2 IMPLANT
SUT VIC AB 0 CT1 36 (SUTURE) ×2 IMPLANT
SUT VIC AB 0 CT2 27 (SUTURE) ×2 IMPLANT
SUT VIC AB 2-0 CT1 27 (SUTURE) ×1
SUT VIC AB 2-0 CT1 TAPERPNT 27 (SUTURE) ×1 IMPLANT
SUTURE FIBERWR #2 38 T-5 BLUE (SUTURE) ×4 IMPLANT
TOWEL OR 17X26 10 PK STRL BLUE (TOWEL DISPOSABLE) ×2 IMPLANT
TOWER SMARTMIX MINI (MISCELLANEOUS) ×1 IMPLANT

## 2021-03-16 NOTE — Plan of Care (Signed)
  Problem: Safety: Goal: Ability to remain free from injury will improve Outcome: Progressing   

## 2021-03-16 NOTE — Brief Op Note (Signed)
03/16/2021  3:15 PM  PATIENT:  Glen Carrillo  66 y.o. male  PRE-OPERATIVE DIAGNOSIS:  Left shoulder endstage osteoarthritis  POST-OPERATIVE DIAGNOSIS:  Left shoulder endstage osteoarthritis  PROCEDURE:  Procedure(s) with comments: TOTAL SHOULDER ARTHROPLASTY (Left) - with ISB [employer]  SURGEON:  Surgeon(s) and Role:    Netta Cedars, MD - Primary  PHYSICIAN ASSISTANT:   ASSISTANTS: Ventura Bruns, PA-C   ANESTHESIA:   regional and general  EBL:  100 mL   BLOOD ADMINISTERED:none  DRAINS: none   LOCAL MEDICATIONS USED:  MARCAINE     SPECIMEN:  No Specimen  DISPOSITION OF SPECIMEN:  N/A  COUNTS:  YES  TOURNIQUET:  * No tourniquets in log *  DICTATION: .Other Dictation: Dictation Number [id number]  PLAN OF CARE: Admit for overnight observation  PATIENT DISPOSITION:  PACU - hemodynamically stable.   Delay start of Pharmacological VTE agent (>24hrs) due to surgical blood loss or risk of bleeding: not applicable

## 2021-03-16 NOTE — Transfer of Care (Signed)
Immediate Anesthesia Transfer of Care Note  Patient: Glen Carrillo  Procedure(s) Performed: TOTAL SHOULDER ARTHROPLASTY (Left: Shoulder)  Patient Location: PACU  Anesthesia Type:General  Level of Consciousness: awake, alert  and oriented  Airway & Oxygen Therapy: Patient Spontanous Breathing and Patient connected to face mask oxygen  Post-op Assessment: Report given to RN and Post -op Vital signs reviewed and stable  Post vital signs: Reviewed and stable  Last Vitals:  Vitals Value Taken Time  BP 144/84 03/16/21 1501  Temp    Pulse 93 03/16/21 1508  Resp 23 03/16/21 1508  SpO2 98 % 03/16/21 1508  Vitals shown include unvalidated device data.  Last Pain:  Vitals:   03/16/21 1045  TempSrc:   PainSc: 3          Complications: No notable events documented.

## 2021-03-16 NOTE — Discharge Instructions (Signed)
Ice to the shoulder constantly.  Keep the incision covered and clean and dry for one week, then ok to get it wet in the shower. ° °Do exercise as instructed several times per day. ° °DO NOT reach behind your back or push up out of a chair with the operative arm. ° °Use a sling while you are up and around for comfort, may remove while seated.  Keep pillow propped behind the operative elbow. ° °Follow up with Dr Najeeb Uptain in two weeks in the office, call [phone] for appt °

## 2021-03-16 NOTE — Anesthesia Postprocedure Evaluation (Signed)
Anesthesia Post Note  Patient: Glen Carrillo  Procedure(s) Performed: TOTAL SHOULDER ARTHROPLASTY (Left: Shoulder)     Patient location during evaluation: PACU Anesthesia Type: Regional and General Level of consciousness: sedated Pain management: pain level controlled Vital Signs Assessment: post-procedure vital signs reviewed and stable Respiratory status: spontaneous breathing and respiratory function stable Cardiovascular status: stable Postop Assessment: no apparent nausea or vomiting Anesthetic complications: no   No notable events documented.  Last Vitals:  Vitals:   03/16/21 1615 03/16/21 1630  BP: 114/80 124/84  Pulse: 90 88  Resp: (!) 23 18  Temp:    SpO2: 100% 99%    Last Pain:  Vitals:   03/16/21 1630  TempSrc:   PainSc: 2                  Merlinda Frederick

## 2021-03-16 NOTE — Anesthesia Preprocedure Evaluation (Addendum)
Anesthesia Evaluation  Patient identified by MRN, date of birth, ID band Patient awake    Reviewed: Allergy & Precautions, NPO status , Patient's Chart, lab work & pertinent test results  Airway Mallampati: II  TM Distance: >3 FB Neck ROM: Full    Dental no notable dental hx.    Pulmonary neg pulmonary ROS, sleep apnea , former smoker,    Pulmonary exam normal breath sounds clear to auscultation       Cardiovascular hypertension, Pt. on medications negative cardio ROS Normal cardiovascular exam+ dysrhythmias (2nd degree AV block, Mobitz 1)  Rhythm:Regular Rate:Normal     Neuro/Psych negative neurological ROS  negative psych ROS   GI/Hepatic negative GI ROS, Neg liver ROS, GERD  Medicated,  Endo/Other  negative endocrine ROSprediabetes  Renal/GU negative Renal ROS  negative genitourinary   Musculoskeletal negative musculoskeletal ROS (+) Arthritis ,   Abdominal   Peds negative pediatric ROS (+)  Hematology negative hematology ROS (+)   Anesthesia Other Findings   Reproductive/Obstetrics negative OB ROS                            Anesthesia Physical Anesthesia Plan  ASA: 2  Anesthesia Plan: General and Regional   Post-op Pain Management: Regional block and Tylenol PO (pre-op)   Induction: Intravenous  PONV Risk Score and Plan: 2 and Treatment may vary due to age or medical condition, Ondansetron, Dexamethasone and Midazolam  Airway Management Planned: Oral ETT  Additional Equipment: None  Intra-op Plan:   Post-operative Plan: Extubation in OR  Informed Consent: I have reviewed the patients History and Physical, chart, labs and discussed the procedure including the risks, benefits and alternatives for the proposed anesthesia with the patient or authorized representative who has indicated his/her understanding and acceptance.     Dental advisory given  Plan Discussed with:  Anesthesiologist and CRNA  Anesthesia Plan Comments: (Interscalene block with Exparel. GETA. Norton Blizzard, MD  )       Anesthesia Quick Evaluation

## 2021-03-16 NOTE — Progress Notes (Signed)
AssistedDr. Elgie Congo with left, ultrasound guided, interscalene  block. Side rails up, monitors on throughout procedure. See vital signs in flow sheet. Tolerated Procedure well.

## 2021-03-16 NOTE — Anesthesia Procedure Notes (Signed)
Procedure Name: Intubation Date/Time: 03/16/2021 12:45 PM Performed by: Maxwell Caul, CRNA Pre-anesthesia Checklist: Patient identified, Emergency Drugs available, Suction available and Patient being monitored Patient Re-evaluated:Patient Re-evaluated prior to induction Oxygen Delivery Method: Circle system utilized Preoxygenation: Pre-oxygenation with 100% oxygen Induction Type: IV induction Ventilation: Mask ventilation without difficulty Laryngoscope Size: Mac and 4 Grade View: Grade I Tube type: Oral Tube size: 7.5 mm Number of attempts: 1 Airway Equipment and Method: Stylet Placement Confirmation: ETT inserted through vocal cords under direct vision, positive ETCO2 and breath sounds checked- equal and bilateral Secured at: 21 cm Tube secured with: Tape Dental Injury: Teeth and Oropharynx as per pre-operative assessment

## 2021-03-16 NOTE — Op Note (Signed)
NAME: Glen, Carrillo MEDICAL RECORD NO: [id number] ACCOUNT NO: [id number] DATE OF BIRTH: September 30, 1955 FACILITY: [facility]ATION: [facility] PHYSICIAN: Doran Heater. Veverly Fells, MD  Operative Report   DATE OF PROCEDURE: 03/16/2021  PREOPERATIVE DIAGNOSIS:  Left shoulder end-stage arthritis.  POSTOPERATIVE DIAGNOSIS:  Left shoulder end-stage arthritis.  PROCEDURE PERFORMED:  Left anatomic shoulder replacement using DePuy [employer] Unite system with Anchor Peg Glenoid.  ATTENDING SURGEON:  Doran Heater. Veverly Fells, MD.  ASSISTANT:  Charletta Cousin Dixon, [place], who was scrubbed during the entire procedure, and necessary for satisfactory completion of surgery.  ANESTHESIA: General anesthesia was used plus interscalene block.  ESTIMATED BLOOD LOSS:  About 150 mL  FLUID REPLACEMENT:  1500 mL crystalloid.  COUNTS:  Instrument counts were correct.   COMPLICATIONS:  No complications.  ANTIBIOTICS:  Perioperative antibiotics were given.  INDICATIONS:  The patient is a 66 year old male with a history of worsening left shoulder pain secondary to end-stage arthritis, bone-on-bone.  Having failed conservative management, the patient presents for operative treatment to restore function and to  eliminate pain.  Informed consent obtained.  DESCRIPTION OF PROCEDURE:  After an adequate level of anesthesia was achieved, the patient was positioned in the modified beach chair position.  Left shoulder correctly identified and sterilely prepped and draped in the usual manner.  Timeout called,  verifying correct patient, correct site.  We entered the patient's shoulder using a standard deltopectoral approach, starting at the coracoid process extending down to the anterior humerus.  Dissection down through subcutaneous tissues, the cephalic vein  was identified and taken laterally with the deltoid.  Pectoralis taken medially.  Conjoined tendon identified and retracted medially.  We placed our deep retractors.  We then tenodesed the  biceps in situ with 0 Vicryl figure-of-eight sutures x2  incorporating part of the pec tendon.  Once we did the biceps in situ tenodesis, we went ahead and released the subscapularis subperiosteally off the lesser tuberosity with the Bovie.  We then used #2 FiberWire in a modified Mason-Allen suture technique  in the free edge of the tendon for traction and repair of the tendon.  At the conclusion of the surgery, we released the inferior capsule progressively externally rotating until we had released all the way around to the back.  We then placed T-handle  Crego over the top of the humeral head underneath the biceps to protect the rotator cuff and then a reverse Hohmann medially.  We resected the humeral head at 20 degrees of retroversion as this patient had some native retroversion on the glenoid.  Once  we had that cut completed with the oscillating saw, we removed excess osteophytes off with a rongeur.  We sized the humeral head component with a 52. Thus, we knew we had to have at least a 48 on our glenoid side.  Once we had that sized, we retracted  the humerus posteriorly, gaining good exposure of the glenoid.  We removed the capsule and the labrum protecting the axillary nerve, placing our deep retractors.  We then identified the center point for our guide pin.  We then placed our guide pin and  checked our alignment, and at this point, we reamed for the 48 APG glenoid.  This was the appropriate size for what the bone would support.  Once we had the initial reaming done, we T-handled peripheral reamer.  We then drilled our central peg hole.  We  placed our guide for the 3 peripheral pulse drilling at 12 o'clock and then our two inferior  holes.  We referenced off the inferior scapular neck or the 6 o'clock position.  Once we had all our preparation done for the APG glenoid, we trialled for the 48  APG glenoid, impacted in position.  We had good bony support and stability.  We removed the trial  component.  We then used Gelfoam soaked with thrombin in the 3 peripheral holes and then a dried these holes well and then cemented using a vacuum mixed  high viscosity cement in the 3 peripheral holes only.  We impacted the real APG glenoid into place, held that until the cement hardened.  We had a nice stable implant.  At this point, we went to the humeral side.  We reamed starting with a size 6 reamer,  reaming up to a size 12 and then we used the 12 osteotome/trial size 5 broach and impacted that in 20 degrees of retroversion.  We then trialed with the 12 stem, 12 metaphysis and in 20 degrees retroversion and impacted that in place.  We then reduced  the shoulder with a 52-18 and then a 52-21 and that was the best fit.  We removed all trial components.  We irrigated thoroughly.  We got down to bleeding bone on the lesser tuberosity and then placed a total of three #2 FiberWire sutures through drill  holes in the lesser for repair of the subscap.  Once we had the sutures placed, we irrigated thoroughly and then used available bone graft from the head with impaction grafting technique.  We used the [place] press-fit stem 12 with a 12 metaphysis and  impacted that in 20 degrees of retroversion. We had excellent support for the stem and then we selected the real 52-21 eccentric humeral head and impacted that in appropriate position to get best bony coverage on the exposed humerus.  Once we had that  done, we reduced the shoulder.  We had excellent soft tissue balance, so we could displace the humerus posteriorly and would relocate back into position, and we could pull it inferiorly about 50% of the diameter of the glenoid and it would reduce again.   So once we had that done, we had excellent range of motion,  we then irrigated and then anatomically repaired the subscapularis back to bone including rotator interval closure using #2 FiberWire.  Once we had our repair complete, we ranged the shoulder.  There  was no restriction in range of motion and nice stability.  We irrigated again and then repaired the deltopectoral interval with 0 Vicryl suture followed by 2-0 Vicryl for subcutaneous closure and 4-0 Monocryl for skin.  Steri-Strips applied  followed by sterile dressing.  The patient tolerated the surgery well.   PAA D: 03/16/2021 3:22:41 pm T: 03/16/2021 11:17:00 pm  JOB: [id number]/ [id number]

## 2021-03-16 NOTE — Interval H&P Note (Signed)
History and Physical Interval Note:  03/16/2021 11:53 AM  Glen Carrillo  has presented today for surgery, with the diagnosis of Left shoulder endstage osteoarthritis.  The various methods of treatment have been discussed with the patient and family. After consideration of risks, benefits and other options for treatment, the patient has consented to  Procedure(s) with comments: TOTAL SHOULDER ARTHROPLASTY (Left) - with ISB as a surgical intervention.  The patient's history has been reviewed, patient examined, no change in status, stable for surgery.  I have reviewed the patient's chart and labs.  Questions were answered to the patient's satisfaction.     Augustin Schooling

## 2021-03-16 NOTE — Anesthesia Procedure Notes (Addendum)
Anesthesia Regional Block: Interscalene brachial plexus block   Pre-Anesthetic Checklist: , timeout performed,  Correct Patient, Correct Site, Correct Laterality,  Correct Procedure, Correct Position, site marked,  Risks and benefits discussed,  Surgical consent,  Pre-op evaluation,  At surgeon's request and post-op pain management  Laterality: Left  Prep: chloraprep       Needles:  Injection technique: Single-shot  Needle Type: Echogenic Stimulator Needle     Needle Length: 10cm  Needle Gauge: 20     Additional Needles:   Procedures:,,,, ultrasound used (permanent image in chart),,    Narrative:  Start time: 03/16/2021 11:00 AM End time: 03/16/2021 11:05 AM Injection made incrementally with aspirations every 5 mL.  Performed by: Personally  Anesthesiologist: Merlinda Frederick, MD

## 2021-03-17 DIAGNOSIS — M19012 Primary osteoarthritis, left shoulder: Secondary | ICD-10-CM | POA: Diagnosis not present

## 2021-03-17 NOTE — Progress Notes (Signed)
Subjective: 1 Day Post-Op Procedure(s) (LRB): TOTAL SHOULDER ARTHROPLASTY (Left) No events over night Patient reports pain as moderate.   Tolerating PO +void  Objective: Vital signs in last 24 hours: Temp:  [98 F (36.7 C)-99.2 F (37.3 C)] 99.2 F (37.3 C) (01/07 0508) Pulse Rate:  [25-109] 68 (01/07 0508) Resp:  [14-31] 18 (01/07 0508) BP: (114-153)/(73-99) 126/87 (01/07 0508) SpO2:  [82 %-100 %] 82 % (01/07 0508) Weight:  [90.7 kg] 90.7 kg (01/06 1045)  Intake/Output from previous day: 01/06 0701 - 01/07 0700 In: 1840 [P.O.:240; I.V.:1500; IV Piggyback:100] Out: 100 [Blood:100] Intake/Output this shift: No intake/output data recorded.  Recent Labs    03/16/21 1043  HGB 13.7   Recent Labs    03/16/21 1043  WBC 9.6  RBC 4.10*  HCT 39.4  PLT 230   Recent Labs    03/16/21 1043  NA 138  K 4.5  CL 105  CO2 21*  BUN 30*  CREATININE 1.47*  GLUCOSE 104*  CALCIUM 9.7   No results for input(s): LABPT, INR in the last 72 hours.  Neurologically intact ABD soft Neurovascular intact Sensation intact distally Intact pulses distally Incision: dressing C/D/I No cellulitis present Compartment soft   Assessment/Plan: 1 Day Post-Op Procedure(s) (LRB): TOTAL SHOULDER ARTHROPLASTY (Left) Advance diet Up with therapy DVT JDB:ZMCE, SCDs, ambulation Encourage IS Will do dressing change Plan D/C today.  F/u with Dr. Veverly Fells in 2 weeks     Charlyne Petrin 03/17/2021, 9:07 AM

## 2021-03-17 NOTE — Evaluation (Signed)
Occupational Therapy Evaluation Patient Details Name: Glen Carrillo MRN: [id number] DOB: 1955-11-20 Today's Date: 03/17/2021   History of Present Illness Patient is a 66 year old male who was admitted with L shoulder end stage osteoarthritis. patient underwent left total shoulder replacement. PMH knee arthroscopy, GERD, HTN   Clinical Impression   s/p shoulder replacement without functional use of left non dominant upper extremity secondary to effects of surgery and interscalene block and shoulder precautions. Therapist provided education and instruction to patient and spouse in regards to exercises, precautions, positioning, donning upper extremity clothing and bathing while maintaining shoulder precautions, ice and edema management and donning/doffing sling. Patient and spouse verbalized understanding and demonstrated as needed. Patient needed assistance to donn shirt, underwear, pants, socks and shoes and provided with instruction on compensatory strategies to perform ADLs. Patient to follow up with MD for further therapy needs.        Recommendations for follow up therapy are one component of a multi-disciplinary discharge planning process, led by the attending physician.  Recommendations may be updated based on patient status, additional functional criteria and insurance authorization.   Follow Up Recommendations  Follow physician's recommendations for discharge plan and follow up therapies    Assistance Recommended at Discharge Frequent or constant Supervision/Assistance  Patient can return home with the following A little help with bathing/dressing/bathroom    Functional Status Assessment  Patient has had a recent decline in their functional status and demonstrates the ability to make significant improvements in function in a reasonable and predictable amount of time.  Equipment Recommendations  None recommended by OT    Recommendations for Other Services       Precautions /  Restrictions Precautions Precautions: Fall;Sternal Precaution Booklet Issued: Yes (comment) Precaution Comments: no ROM of shoulder, AROM of elbow wrist and hand ok. Required Braces or Orthoses: Sling Restrictions Weight Bearing Restrictions: Yes LUE Weight Bearing: Non weight bearing      Mobility Bed Mobility Overal bed mobility: Modified Independent                  Transfers Overall transfer level: Needs assistance Equipment used: None Transfers: Sit to/from Stand Sit to Stand: Supervision           General transfer comment: impulsive      Balance Overall balance assessment: Mild deficits observed, not formally tested                                         ADL either performed or assessed with clinical judgement   ADL Overall ADL's : Needs assistance/impaired Eating/Feeding: Supervision/ safety;Sitting   Grooming: Wash/dry face;Standing;Set up   Upper Body Bathing: Minimal assistance;Sitting Upper Body Bathing Details (indicate cue type and reason): educated on sea saw method to patient and wife Lower Body Bathing: Minimal assistance;Sitting/lateral leans   Upper Body Dressing : Moderate assistance;Sitting;Cueing for UE precautions   Lower Body Dressing: Moderate assistance;Sit to/from stand Lower Body Dressing Details (indicate cue type and reason): patient was educated on importance of sitting down to don pants v.s. standing on one leg while wife attempts to don pants. patient verbalized understanding. patient was educated on importance of slowing down to complete tasks until comfortable in new restrictions. patient verbalized understanding. wife verbalized understanding as well. Toilet Transfer: Supervision/safety;Ambulation;Regular Toilet   Toileting- Water quality scientist and Hygiene: Supervision/safety;Sit to/from stand       Functional mobility  during ADLs: Supervision/safety       Vision Patient Visual Report: No change  from baseline       Perception     Praxis      Pertinent Vitals/Pain Pain Assessment: Faces Faces Pain Scale: Hurts little more Pain Location: L shoulder with movement Pain Descriptors / Indicators: Discomfort;Grimacing Pain Intervention(s): Monitored during session;Repositioned;Premedicated before session     Hand Dominance     Extremity/Trunk Assessment Upper Extremity Assessment Upper Extremity Assessment: LUE deficits/detail LUE Deficits / Details: L total shoulder replacement.   Lower Extremity Assessment Lower Extremity Assessment: Overall WFL for tasks assessed   Cervical / Trunk Assessment Cervical / Trunk Assessment: Normal   Communication     Cognition Arousal/Alertness: Awake/alert Behavior During Therapy: WFL for tasks assessed/performed Overall Cognitive Status: Within Functional Limits for tasks assessed                                 General Comments: patient noted to have poor safety awareness with LB dressing tasks with wife present for session.     General Comments       Exercises     Shoulder Instructions Shoulder Instructions Donning/doffing shirt without moving shoulder: Caregiver independent with task;Moderate assistance Method for sponge bathing under operated UE: Caregiver independent with task Donning/doffing sling/immobilizer: Caregiver independent with task;Minimal assistance Correct positioning of sling/immobilizer: Minimal assistance;Caregiver independent with task ROM for elbow, wrist and digits of operated UE: Caregiver independent with task Sling wearing schedule (on at all times/off for ADL's): Caregiver independent with task;Minimal assistance Proper positioning of operated UE when showering: Caregiver independent with task Positioning of UE while sleeping: Caregiver independent with task;Supervision/safety    Home Living Family/patient expects to be discharged to:: Private residence Living Arrangements:  Spouse/significant other;Children Available Help at Discharge: Family;Available PRN/intermittently                                    Prior Functioning/Environment                          OT Problem List: Decreased knowledge of precautions;Decreased knowledge of use of DME or AE;Decreased safety awareness;Impaired balance (sitting and/or standing);Decreased activity tolerance;Impaired UE functional use      OT Treatment/Interventions:      OT Goals(Current goals can be found in the care plan section) Acute Rehab OT Goals OT Goal Formulation: All assessment and education complete, DC therapy  OT Frequency:      Co-evaluation              AM-PAC OT "6 Clicks" Daily Activity     Outcome Measure Help from another person eating meals?: A Little Help from another person taking care of personal grooming?: A Little Help from another person toileting, which includes using toliet, bedpan, or urinal?: A Little Help from another person bathing (including washing, rinsing, drying)?: A Little Help from another person to put on and taking off regular upper body clothing?: A Little Help from another person to put on and taking off regular lower body clothing?: A Little 6 Click Score: 18   End of Session Nurse Communication: Mobility status  Activity Tolerance: Patient tolerated treatment well Patient left: in bed;with call bell/phone within reach;with family/visitor present  OT Visit Diagnosis: Pain Pain - Right/Left: Left Pain - part of body: Shoulder  Time: 2035-5974 OT Time Calculation (min): 25 min Charges:  OT General Charges $OT Visit: 1 Visit OT Evaluation $OT Eval Low Complexity: 1 Low OT Treatments $Self Care/Home Management : 8-22 mins  Jackelyn Poling OTR/L, MS Acute Rehabilitation Department Office# [phone] Pager# [phone]   Glen Carrillo 03/17/2021, 9:41 AM

## 2021-03-17 NOTE — Plan of Care (Signed)
Patient discharged home with wife. Ivan Anchors, RN 03/17/21 11:26 AM

## 2021-03-17 NOTE — Plan of Care (Signed)
°  Problem: Education: Goal: Knowledge of General Education information will improve Description: Including pain rating scale, medication(s)/side effects and non-pharmacologic comfort measures Outcome: Progressing   Problem: Health Behavior/Discharge Planning: Goal: Ability to manage health-related needs will improve Outcome: Progressing   Problem: Activity: Goal: Risk for activity intolerance will decrease Outcome: [place], RN 03/17/21 9:08 AM

## 2021-03-19 ENCOUNTER — Encounter (HOSPITAL_COMMUNITY): Payer: Self-pay | Admitting: Orthopedic Surgery

## 2021-03-21 NOTE — Discharge Summary (Signed)
In most cases prophylactic antibiotics for Dental procdeures after total joint surgery are not necessary.  Exceptions are as follows:  1. History of prior total joint infection  2. Severely immunocompromised (Organ Transplant, cancer chemotherapy, Rheumatoid biologic meds such as [place])  3. Poorly controlled diabetes (A1C &gt; 8.0, blood glucose over 200)  If you have one of these conditions, contact your surgeon for an antibiotic prescription, prior to your dental procedure. Orthopedic Discharge Summary        Physician Discharge Summary  Patient ID: Glen Carrillo MRN: [id number] DOB/AGE: 1956/02/23 66 y.o.  Admit date: 03/16/2021 Discharge date: 03/17/21  Procedures:  Procedure(s) (LRB): TOTAL SHOULDER ARTHROPLASTY (Left)  Attending Physician:  Dr. 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 Patient tolerated the procedure well and brought to the recovery room in good condition and subsequently to the floor. Patient had an uncomplicated hospital course and was stable for discharge.   Disposition: Discharge disposition: 01-Home or Self Care      with follow up in 2 weeks    Follow-up Information     Netta Cedars, MD. Call in 2 week(s).   Specialty: Orthopedic Surgery Why: call [phone] for appt Contact information: [address] [place]  [address] [phone]                 Dental Antibiotics:  In most cases  prophylactic antibiotics for Dental procdeures after total joint surgery are not necessary.  Exceptions are as follows:  1. History of prior total joint infection  2. Severely immunocompromised (Organ Transplant, cancer chemotherapy, Rheumatoid biologic meds such as [place])  3. Poorly controlled diabetes (A1C &gt; 8.0, blood glucose over 200)  If you have one of these conditions, contact your surgeon for an antibiotic prescription, prior to your dental procedure.    Allergies as of 03/17/2021       Reactions   Metformin And Related    swelling        Medication List     TAKE these medications    acetaminophen 500 MG tablet Commonly known as: TYLENOL Take 1,000 mg by mouth every 6 (six) hours as needed for moderate pain.   atorvastatin 80 MG tablet Commonly known as: LIPITOR Take 80 mg by mouth daily.   Co Q-10 100 MG Caps Take 100 mg by mouth daily.   lisinopril-hydrochlorothiazide 20-25 MG tablet Commonly known as: ZESTORETIC Take 1 tablet by mouth daily.   methocarbamol 500 MG tablet Commonly known as: Robaxin Take 1 tablet (500 mg total) by mouth every 8 (eight) hours as needed for muscle spasms.   multivitamin with minerals Tabs tablet Take 1 tablet by mouth daily.   omeprazole 40 MG capsule Commonly known as: PRILOSEC Take 40 mg by mouth daily.   ondansetron 4 MG tablet Commonly known as: Zofran Take 1 tablet (4 mg total) by mouth every 8 (eight) hours as needed for nausea, vomiting or refractory nausea / vomiting.   oxyCODONE-acetaminophen 5-325 MG tablet Commonly  known as: Percocet Take 1 tablet by mouth every 4 (four) hours as needed for severe pain.          Signed: [place] Carrillo 03/21/2021, 8:46 AM  [facility] is now [facility] [address] [place], [place], [place] [address] Phone: [place]

## 2021-04-03 ENCOUNTER — Encounter (HOSPITAL_COMMUNITY): Admission: RE | Admit: 2021-04-03 | Payer: PRIVATE HEALTH INSURANCE | Source: Ambulatory Visit

## 2021-04-06 ENCOUNTER — Encounter (HOSPITAL_BASED_OUTPATIENT_CLINIC_OR_DEPARTMENT_OTHER): Payer: Self-pay | Admitting: Cardiovascular Disease

## 2021-04-06 NOTE — Procedures (Signed)
Patient Name: Glen Carrillo, Lamp Date: 03/13/2021 Gender: Male D.O.B: 08-19-1955 Age (years): 65 Referring Provider: Skeet Latch Height (inches): 71 Interpreting Physician: Shelva Majestic MD, ABSM Weight (lbs): 200 RPSGT: Laren Everts BMI: 28 MRN: [id number] Neck Size: 16.50  CLINICAL INFORMATION Sleep Study Type: NPSG  Indication for sleep study: Fatigue, Hypertension, Snoring  Epworth Sleepiness Score: 7  SLEEP STUDY TECHNIQUE As per the AASM Manual for the Scoring of Sleep and Associated Events v2.3 (April 2016) with a hypopnea requiring 4% desaturations.  The channels recorded and monitored were frontal, central and occipital EEG, electrooculogram (EOG), submentalis EMG (chin), nasal and oral airflow, thoracic and abdominal wall motion, anterior tibialis EMG, snore microphone, electrocardiogram, and pulse oximetry.  MEDICATIONS acetaminophen (TYLENOL) 500 MG tablet atorvastatin (LIPITOR) 80 MG tablet Coenzyme Q10 (CO Q-10) 100 MG CAPS lisinopril-hydrochlorothiazide (ZESTORETIC) 20-25 MG tablet methocarbamol (ROBAXIN) 500 MG tablet Multiple Vitamin (MULTIVITAMIN WITH MINERALS) TABS tablet omeprazole (PRILOSEC) 40 MG capsule ondansetron (ZOFRAN) 4 MG tablet oxyCODONE-acetaminophen (PERCOCET) 5-325 MG tablet  Medications self-administered by patient taken the night of the study : N/A  SLEEP ARCHITECTURE The study was initiated at 9:54:21 PM and ended at 5:08:25 AM.  Sleep onset time was 37.2 minutes and the sleep efficiency was 72.7%%. The total sleep time was 315.5 minutes.  Stage REM latency was 97.5 minutes.  The patient spent 21.6%% of the night in stage N1 sleep, 53.4%% in stage N2 sleep, 0.0%% in stage N3 and 25% in REM.  Alpha intrusion was absent.  Supine sleep was 0.48%.  RESPIRATORY PARAMETERS The overall apnea/hypopnea index (AHI) was 17.9 per hour. The repiratory disturbance index (RDI) was 41.1/h. There were 33 total apneas,  including 32 obstructive, 0 central and 1 mixed apneas. There were 61 hypopneas and 122 RERAs.  The AHI during Stage REM sleep was 52.4 per hour.  AHI while supine was 40.0 per hour.  The mean oxygen saturation was 95.2%. The minimum SpO2 during sleep was 81.0%.  Moderate snoring was noted during this study.  CARDIAC DATA The 2 lead EKG demonstrated sinus rhythm. The mean heart rate was 55.1 beats per minute. Other EKG findings include: None.  LEG MOVEMENT DATA The total PLMS were 0 with a resulting PLMS index of 0.0. Associated arousal with leg movement index was 0.2 .  IMPRESSIONS - Moderate obstructive sleep apnea overall (AHI 17.9/h; RDI 41.1/h); however, sleep apnea was severe with supine sleep (AHI 40.0) and during REM sleep (AHI 52.4/h).  - Moderate oxygen desaturation to a nadir of 81.0%. - The patient snored with moderate snoring volume. - Cardiac abnormalities were noted during this study with periods of 2: 1 AV block. - Clinically significant periodic limb movements did not occur during sleep. No significant associated arousals.  DIAGNOSIS - Obstructive Sleep Apnea (G47.33) - Nocturnal Hypoxemia (G47.36) - AV Block  RECOMMENDATIONS - Therapeutic CPAP titration to determine optimal pressure required to alleviate sleep disordered breathing. Recommend an in-lab titration with cardiac monitoring, but if unable initiate Auto-PAP with EPR of 3 at 6 - 18 cm of water. - Effort should be made to optimize nasal and oropharyngeal patency. - Positional therapy avoiding supine position during sleep. - Avoid alcohol, sedatives and other CNS depressants that may worsen sleep apnea and disrupt normal sleep architecture. - Sleep hygiene should be reviewed to assess factors that may improve sleep quality. - Weight management and regular exercise should be initiated or continued if appropriate.  [Electronically signed] 04/06/2021 08:32 AM  Shelva Majestic MD, [facility],  ABSM Diplomate,  American Board of Sleep Medicine   NPI: [id number] [place] PH: [phone]   FX: [phone] [place]

## 2021-04-11 ENCOUNTER — Other Ambulatory Visit (HOSPITAL_COMMUNITY): Payer: PRIVATE HEALTH INSURANCE

## 2021-04-11 ENCOUNTER — Other Ambulatory Visit: Payer: Self-pay | Admitting: Cardiovascular Disease

## 2021-04-11 ENCOUNTER — Telehealth: Payer: Self-pay | Admitting: *Deleted

## 2021-04-11 DIAGNOSIS — G4736 Sleep related hypoventilation in conditions classified elsewhere: Secondary | ICD-10-CM

## 2021-04-11 DIAGNOSIS — G4733 Obstructive sleep apnea (adult) (pediatric): Secondary | ICD-10-CM

## 2021-04-11 DIAGNOSIS — R0683 Snoring: Secondary | ICD-10-CM

## 2021-04-11 NOTE — Telephone Encounter (Signed)
Patient notified of sleep study results and recommendations. He agrees to proceed with having CPAP titration.

## 2021-04-11 NOTE — Telephone Encounter (Signed)
-----   Message from Troy Sine, MD sent at 04/06/2021  8:37 AM EST ----- Mariann Laster please notify patient of the results of the sleep study.  During the study heart block was noted.  Try for in lab CPAP titration; if unable then initiate AutoPap.

## 2021-05-13 ENCOUNTER — Ambulatory Visit (HOSPITAL_BASED_OUTPATIENT_CLINIC_OR_DEPARTMENT_OTHER): Payer: PRIVATE HEALTH INSURANCE | Admitting: Cardiovascular Disease

## 2021-05-13 ENCOUNTER — Other Ambulatory Visit: Payer: Self-pay

## 2021-07-01 ENCOUNTER — Ambulatory Visit (HOSPITAL_BASED_OUTPATIENT_CLINIC_OR_DEPARTMENT_OTHER): Payer: Medicare Other | Attending: Cardiovascular Disease | Admitting: Cardiovascular Disease

## 2021-07-01 ENCOUNTER — Other Ambulatory Visit: Payer: Self-pay

## 2021-07-01 DIAGNOSIS — G4736 Sleep related hypoventilation in conditions classified elsewhere: Secondary | ICD-10-CM | POA: Diagnosis not present

## 2021-07-01 DIAGNOSIS — R0683 Snoring: Secondary | ICD-10-CM | POA: Diagnosis not present

## 2021-07-01 DIAGNOSIS — G4733 Obstructive sleep apnea (adult) (pediatric): Secondary | ICD-10-CM

## 2021-07-08 ENCOUNTER — Encounter (HOSPITAL_BASED_OUTPATIENT_CLINIC_OR_DEPARTMENT_OTHER): Payer: Self-pay | Admitting: Cardiovascular Disease

## 2021-07-08 NOTE — Procedures (Signed)
? ? ? ?Patient Name: Glen Carrillo, Glen Carrillo ?Study Date: 07/01/2021 ?Gender: Male ?D.O.B: 09/29/1955 ?Age (years): 42 ?Referring Provider: Skeet Latch ?Height (inches): 71 ?Interpreting Physician: Shelva Majestic MD, ABSM ?Weight (lbs): 205 ?RPSGT: Steffey, Lennette Bihari ?BMI: 29 ?MRN: [id number] ?Neck Size: 16.50 ? ?CLINICAL INFORMATION ?The patient is referred for a CPAP titration to treat sleep apnea. ? ?Date of NPSG: 03/13/2021: AHI 17.9/h; RDI 41.1/h: REM AHI 52.4/h: supine AHI 40/h; O2 nadir 81%. ? ?SLEEP STUDY TECHNIQUE ?As per the AASM Manual for the Scoring of Sleep and Associated Events v2.3 (April 2016) with a hypopnea requiring 4% desaturations. ? ?The channels recorded and monitored were frontal, central and occipital EEG, electrooculogram (EOG), submentalis EMG (chin), nasal and oral airflow, thoracic and abdominal wall motion, anterior tibialis EMG, snore microphone, electrocardiogram, and pulse oximetry. Continuous positive airway pressure (CPAP) was initiated at the beginning of the study and titrated to treat sleep-disordered breathing. ? ?MEDICATIONS ?acetaminophen (TYLENOL) 500 MG tablet ?atorvastatin (LIPITOR) 80 MG tablet ?Coenzyme Q10 (CO Q-10) 100 MG CAPS ?lisinopril-hydrochlorothiazide (ZESTORETIC) 20-25 MG tablet ?methocarbamol (ROBAXIN) 500 MG tablet ?Multiple Vitamin (MULTIVITAMIN WITH MINERALS) TABS tablet ?omeprazole (PRILOSEC) 40 MG capsule ?ondansetron (ZOFRAN) 4 MG tablet ?oxyCODONE-acetaminophen (PERCOCET) 5-325 MG tablet  ?Medications self-administered by patient taken the night of the study : N/A ? ?TECHNICIAN COMMENTS ?Comments added by technician: None ?Comments added by scorer: N/A ? ?RESPIRATORY PARAMETERS ?Optimal PAP Pressure (cm): 13 AHI at Optimal Pressure (/hr): 0 ?Overall Minimal O2 (%): 90.0 Supine % at Optimal Pressure (%): 3 ?Minimal O2 at Optimal Pressure (%): 94.0  ? ?SLEEP ARCHITECTURE ?The study was initiated at 10:08:50 PM and ended at 4:30:58 AM. ? ?Sleep onset time was  18.9 minutes and the sleep efficiency was 76.1%%. The total sleep time was 291 minutes. ? ?The patient spent 8.1%% of the night in stage N1 sleep, 66.3%% in stage N2 sleep, 0.0%% in stage N3 and 25.6% in REM.Stage REM latency was 96.5 minutes ? ?Wake after sleep onset was 72.2. Alpha intrusion was absent. Supine sleep was 1.20%. ? ?CARDIAC DATA ?The 2 lead EKG demonstrated sinus rhythm. The mean heart rate was 50.1 beats per minute. Other EKG findings include: None. ? ?LEG MOVEMENT DATA ?The total Periodic Limb Movements of Sleep (PLMS) were 0. The PLMS index was 0.0. A PLMS index of <15 is considered normal in adults. ? ?IMPRESSIONS ?- CPAP was initiated at 7 cm and was titrated to optimal PAP pressure at 13 cm of water (AHI 0; RDI 0.5/h; O2 nadir 94%; absent supine sleep). ?- No oxygen desaturations were observed during this titration with O2 nadir 90%. ?- The patient snored with moderate snoring volume during this titration study. ?- No cardiac abnormalities were observed during this study. ?- Clinically significant periodic limb movements were not noted during this study. Arousals associated with PLMs were rare. ? ?DIAGNOSIS ?- Obstructive Sleep Apnea (G47.33) ? ?RECOMMENDATIONS ?- Recommend an initial trial of CPAP Auto therapy with EPR of 3 at 11 - 16 cm H2O with heated humidification. A Medium size Fisher[employer]Paykel Full Face Simplus mask was used for the titration. ?- Effort should be made to optimize nasal and oropharyngeal patency. ?- Avoid alcohol, sedatives and other CNS depressants that may worsen sleep apnea and disrupt normal sleep architecture. ?- Sleep hygiene should be reviewed to assess factors that may improve sleep quality. ?- Weight management and regular exercise should be initiated or continued. ?- Recommend a download and sleep clinic evaluation after 4 weeks of therapy. ? ? ?[Electronically signed] 07/08/2021  05:12 PM ? ?Shelva Majestic MD, [facility], ABSM ?Diplomate, Tax adviser of Sleep  Medicine ? ?NPI: [id number] ? ?[place] ?PH: [phone]   FX: [phone] ?ACCREDITED BY THE AMERICAN ACADEMY OF SLEEP MEDICINE ? ?

## 2021-07-09 ENCOUNTER — Telehealth: Payer: Self-pay | Admitting: *Deleted

## 2021-07-09 NOTE — Telephone Encounter (Signed)
Patient notified CPAP titration has been completed. Pressure has been obtained for CPAP machine. Order has been sent to [facility] via [facility] portal. ?

## 2021-07-09 NOTE — Telephone Encounter (Signed)
-----   Message from Troy Sine, MD sent at 07/08/2021  5:17 PM EDT ----- ?Mariann Laster, please notify pt of results and set up with DME for CPAP initiation. ?

## 2022-02-06 ENCOUNTER — Other Ambulatory Visit: Payer: Self-pay | Admitting: Family Medicine

## 2022-02-06 DIAGNOSIS — R918 Other nonspecific abnormal finding of lung field: Secondary | ICD-10-CM

## 2022-03-08 ENCOUNTER — Ambulatory Visit
Admission: RE | Admit: 2022-03-08 | Discharge: 2022-03-08 | Disposition: A | Discharge status: Home / Self Care | Payer: Medicare Other | Source: Ambulatory Visit | Attending: Family Medicine | Admitting: Family Medicine

## 2022-03-08 DIAGNOSIS — R918 Other nonspecific abnormal finding of lung field: Secondary | ICD-10-CM

## 2022-04-18 ENCOUNTER — Telehealth: Payer: Self-pay | Admitting: *Deleted

## 2022-04-18 NOTE — Telephone Encounter (Signed)
   Name: Glen Carrillo  DOB: 1955-10-19  MRN: [id number]  Primary Cardiologist: None   Preoperative team, please contact this patient and set up a phone call appointment for further preoperative risk assessment. Please obtain consent and complete medication review. Thank you for your help.  I confirm that guidance regarding antiplatelet and oral anticoagulation therapy has been completed and, if necessary, noted below.  None requested   Mable Fill, Marissa Nestle, NP 04/18/2022, 3:50 PM [place]

## 2022-04-18 NOTE — Telephone Encounter (Signed)
Pt called back and has been scheduled for tele pre op appt 04/19/22; add on per pre op APP, due to procedure date. Med rec and consent are done.   Pt did have some questions about what meds to hold for his surgery, these were not cardiac meds that he was asking about. Pt also asked about the 2 days of rehab and is this 2 days after the surgery. I stated to the pt that these are questions for the surgeons office. Pt thanked me for the help today.

## 2022-04-18 NOTE — Telephone Encounter (Signed)
Pt called back and has been scheduled for tele pre op appt 04/19/22; add on per pre op APP, due to procedure date. Med rec and consent are done.  Pt did have some questions about what meds to hold for his surgery, these were not cardiac meds that he was asking about. Pt also asked about the 2 days of rehab and is this 2 days after the surgery. I stated to the pt that these are questions for the surgeons office. Pt thanked me for the help today.     Patient Consent for Virtual Visit        Glen Carrillo has provided verbal consent on 04/18/2022 for a virtual visit (video or telephone).   CONSENT FOR VIRTUAL VISIT FOR:  Glen Carrillo  By participating in this virtual visit I agree to the following:  I hereby voluntarily request, consent and authorize [place] and its employed or contracted physicians, physician assistants, nurse practitioners or other licensed health care professionals (the Practitioner), to provide me with telemedicine health care services (the "Services") as deemed necessary by the treating Practitioner. I acknowledge and consent to receive the Services by the Practitioner via telemedicine. I understand that the telemedicine visit will involve communicating with the Practitioner through live audiovisual communication technology and the disclosure of certain medical information by electronic transmission. I acknowledge that I have been given the opportunity to request an in-person assessment or other available alternative prior to the telemedicine visit and am voluntarily participating in the telemedicine visit.  I understand that I have the right to withhold or withdraw my consent to the use of telemedicine in the course of my care at any time, without affecting my right to future care or treatment, and that the Practitioner or I may terminate the telemedicine visit at any time. I understand that I have the right to inspect all information obtained and/or recorded in  the course of the telemedicine visit and may receive copies of available information for a reasonable fee.  I understand that some of the potential risks of receiving the Services via telemedicine include:  Delay or interruption in medical evaluation due to technological equipment failure or disruption; Information transmitted may not be sufficient (e.g. poor resolution of images) to allow for appropriate medical decision making by the Practitioner; and/or  In rare instances, security protocols could fail, causing a breach of personal health information.  Furthermore, I acknowledge that it is my responsibility to provide information about my medical history, conditions and care that is complete and accurate to the best of my ability. I acknowledge that Practitioner's advice, recommendations, and/or decision may be based on factors not within their control, such as incomplete or inaccurate data provided by me or distortions of diagnostic images or specimens that may result from electronic transmissions. I understand that the practice of medicine is not an exact science and that Practitioner makes no warranties or guarantees regarding treatment outcomes. I acknowledge that a copy of this consent can be made available to me via my patient portal ([place]), or I can request a printed copy by calling the office of [place].    I understand that my insurance will be billed for this visit.   I have read or had this consent read to me. I understand the contents of this consent, which adequately explains the benefits and risks of the Services being provided via telemedicine.  I have been provided ample opportunity to ask questions regarding this  consent and the Services and have had my questions answered to my satisfaction. I give my informed consent for the services to be provided through the use of telemedicine in my medical care

## 2022-04-18 NOTE — Telephone Encounter (Signed)
   Pre-operative Risk Assessment    Patient Name: Glen Carrillo  DOB: 1955-07-20 MRN: [id number]      Request for Surgical Clearance    Procedure:   RIGHT TOTAL KNEE ARTHROPLASTY  Date of Surgery:  Clearance 04/24/22                                 Surgeon:  DR. Gaynelle Arabian Surgeon's Group or Practice Name:  [facility] Phone number:  [phone]; [facility] Fax number:  [phone]   Type of Clearance Requested:   - Medical ; NO MEDICATIONS LISTED AS NEEDING TO BE HELD   Type of Anesthesia:   CHOICE   Additional requests/questions:    Jiles Prows   04/18/2022, 3:06 PM

## 2022-04-18 NOTE — Telephone Encounter (Signed)
Left message to call back to schedule a tele pre op appt for pre op clearance.

## 2022-04-19 ENCOUNTER — Ambulatory Visit: Payer: Medicare Other

## 2022-04-19 NOTE — Telephone Encounter (Signed)
I s/w the pt and informed him per the pre op app he is needing an in office appt since he has not been seen since 2022. Pt was not happy but agreeable and will see Laurann Montana, NP 04/22/22 @ 8:25 at the [facility] location. I assured the pt that I will update the surgeon's office as well.   I will cancel the tele pre op appt today.

## 2022-04-19 NOTE — Progress Notes (Deleted)
Virtual Visit via Telephone Note   Because of Glen Carrillo's co-morbid illnesses, he is at least at moderate risk for complications without adequate follow up.  This format is felt to be most appropriate for this patient at this time.  The patient did not have access to video technology/had technical difficulties with video requiring transitioning to audio format only (telephone).  All issues noted in this document were discussed and addressed.  No physical exam could be performed with this format.  Please refer to the patient's chart for his consent to telehealth for [facility].  Evaluation Performed:  Preoperative cardiovascular risk assessment _____________   Date:  04/19/2022   Patient ID:  Glen Carrillo, DOB 1955/08/27, MRN [id number] Patient Location:  Home Provider location:   Office  Primary Care Provider:  Jamie Kato Primary Cardiologist:  None  Chief Complaint / Patient Profile   67 y.o. y/o male with a h/o *** who is pending right total knee arthroplasty and presents today for telephonic preoperative cardiovascular risk assessment.  History of Present Illness    Glen Carrillo is a 67 y.o. male who presents via audio/video conferencing for a telehealth visit today.  Pt was last seen in cardiology clinic on 12/25/2020 by Dr. Oval Linsey.  At that time ELII OSCARSON was doing well ***.  The patient is now pending procedure as outlined above. Since his last visit, he ***  Past Medical History    Past Medical History:  Diagnosis Date   Arthritis    Essential hypertension 12/25/2020   GERD (gastroesophageal reflux disease)    Hypertension    Pre-diabetes    Pure hypercholesterolemia 12/25/2020   Second degree atrioventricular block, Mobitz (type) I 12/25/2020   Past Surgical History:  Procedure Laterality Date   ARTHROSCOPY KNEE W/ DRILLING Left    KNEE ARTHROSCOPY     TOTAL SHOULDER ARTHROPLASTY Left 03/16/2021   Procedure: TOTAL SHOULDER  ARTHROPLASTY;  Surgeon: Netta Cedars, MD;  Location: [facility];  Service: Orthopedics;  Laterality: Left;  with ISB   WISDOM TOOTH EXTRACTION      Allergies  Allergies  Allergen Reactions   Metformin And Related     swelling    Home Medications    Prior to Admission medications   Medication Sig Start Date End Date Taking? Authorizing Provider  acetaminophen (TYLENOL) 500 MG tablet Take 1,000 mg by mouth every 6 (six) hours as needed for moderate pain.    [provider]  atorvastatin (LIPITOR) 80 MG tablet Take 80 mg by mouth daily. 11/02/20   [provider]  Coenzyme Q10 (CO Q-10) 100 MG CAPS Take 100 mg by mouth daily.    [provider]  lisinopril-hydrochlorothiazide (ZESTORETIC) 20-25 MG tablet Take 1 tablet by mouth daily. 12/03/20   [provider]  methocarbamol (ROBAXIN) 500 MG tablet Take 1 tablet (500 mg total) by mouth every 8 (eight) hours as needed for muscle spasms. 03/16/21   Netta Cedars, MD  Multiple Vitamin (MULTIVITAMIN WITH MINERALS) TABS tablet Take 1 tablet by mouth daily.    [provider]  omeprazole (PRILOSEC) 40 MG capsule Take 40 mg by mouth daily. 12/03/20   [provider]  tamsulosin (FLOMAX) 0.4 MG CAPS capsule Take 0.4 mg by mouth daily. 02/05/22   [provider]    Physical Exam    Vital Signs:  DALYN BURCZYK does not have vital signs available for review today.***  Given telephonic nature of communication,  physical exam is limited. AAOx3. NAD. Normal affect.  Speech and respirations are unlabored.  Accessory Clinical Findings    None  Assessment & Plan    1.  Preoperative Cardiovascular Risk Assessment:  The patient was advised that if he develops new symptoms prior to surgery to contact our office to arrange for a follow-up visit, and he verbalized understanding.  (Reminder: Include SBE prophylaxis/Antiplatelet/Anticoag Instructions***)  A copy of this note will be routed to  requesting surgeon.  Time:   Today, I have spent *** minutes with the patient with telehealth technology discussing medical history, symptoms, and management plan.     Mable Fill, Marissa Nestle, NP  04/19/2022, 1:37 PM

## 2022-04-22 ENCOUNTER — Ambulatory Visit (INDEPENDENT_AMBULATORY_CARE_PROVIDER_SITE_OTHER): Payer: Medicare Other | Admitting: Family

## 2022-04-22 ENCOUNTER — Encounter (HOSPITAL_BASED_OUTPATIENT_CLINIC_OR_DEPARTMENT_OTHER): Payer: Self-pay | Admitting: Family

## 2022-04-22 VITALS — BP 130/80 | HR 44 | Ht 71.0 in | Wt 191.0 lb

## 2022-04-22 DIAGNOSIS — I10 Essential (primary) hypertension: Secondary | ICD-10-CM | POA: Diagnosis not present

## 2022-04-22 DIAGNOSIS — E7800 Pure hypercholesterolemia, unspecified: Secondary | ICD-10-CM | POA: Diagnosis not present

## 2022-04-22 DIAGNOSIS — Z01818 Encounter for other preprocedural examination: Secondary | ICD-10-CM

## 2022-04-22 DIAGNOSIS — I441 Atrioventricular block, second degree: Secondary | ICD-10-CM

## 2022-04-22 DIAGNOSIS — E782 Mixed hyperlipidemia: Secondary | ICD-10-CM

## 2022-04-22 DIAGNOSIS — G4733 Obstructive sleep apnea (adult) (pediatric): Secondary | ICD-10-CM

## 2022-04-22 DIAGNOSIS — R7303 Prediabetes: Secondary | ICD-10-CM

## 2022-04-22 MED ORDER — EZETIMIBE 10 MG PO TABS
10.0000 mg | ORAL_TABLET | Freq: Every day | ORAL | 3 refills | Status: AC
Start: 2022-04-22 — End: 2024-02-03

## 2022-04-22 MED ORDER — EZETIMIBE 10 MG PO TABS: 10.0000 mg | ORAL_TABLET | Freq: Every day | ORAL | 3 refills | Status: DC

## 2022-04-22 MED ORDER — ATORVASTATIN CALCIUM 40 MG PO TABS: 40.0000 mg | ORAL_TABLET | Freq: Every day | ORAL | 3 refills | Status: DC

## 2022-04-22 MED ORDER — ATORVASTATIN CALCIUM 40 MG PO TABS: 40.0000 mg | ORAL_TABLET | Freq: Every day | ORAL | 3 refills | Status: AC

## 2022-04-22 NOTE — Progress Notes (Signed)
Office Visit    Patient Name: Glen Carrillo Date of Encounter: 04/22/2022  PCP:  Jamie Kato   [place]  Cardiologist:  Skeet Latch, MD  Advanced Practice Provider:  No care team member to display Electrophysiologist:  None     Chief Complaint    Glen Carrillo is a 67 y.o. male presents today for preop clearance   Past Medical History    Past Medical History:  Diagnosis Date   Arthritis    Essential hypertension 12/25/2020   GERD (gastroesophageal reflux disease)    Hypertension    Pre-diabetes    Pure hypercholesterolemia 12/25/2020   Second degree atrioventricular block, Mobitz (type) I 12/25/2020   Past Surgical History:  Procedure Laterality Date   ARTHROSCOPY KNEE W/ DRILLING Left    KNEE ARTHROSCOPY     TOTAL SHOULDER ARTHROPLASTY Left 03/16/2021   Procedure: TOTAL SHOULDER ARTHROPLASTY;  Surgeon: Netta Cedars, MD;  Location: [facility];  Service: Orthopedics;  Laterality: Left;  with ISB   WISDOM TOOTH EXTRACTION      Allergies  Allergies  Allergen Reactions   Metformin And Related     swelling    History of Present Illness    Glen Carrillo is a 67 y.o. male with a hx of arthritis, GERD, HTN, OSA, second degree AV block type I last seen 12/25/20 by Dr. Oval Linsey.  Seen 12/2020 for preoperative clearance after abnormal EKG with Mobitz type 2 second degree AV block with ventricular rate of 48 bpm not on AV nodal blocking agents. Blood pressure was controlled on Lisinopril/HCTZ. He was asymptomatic in regards to heart block with only one episode of syncope 8 years earlier. Carotid duplex revealed minimal blockage in carotid arteries and sleep study revealed  sleep apnea and CPAP titration completed.  Presents today for preoperative clearance for right total knee arthroplasty with Dr. Wynelle Link. He reports feeling overall well. Shares he is not wearing his CPAP machine and returned it to the company. He says he was  choking at night while wearing CPAP due to the cord on the machine. Denies headaches, dizziness, chest pain, nor swelling in lower extremities. He workout out daily and works full time. He monitors his blood pressure at home daily.   EKGs/Labs/Other Studies Reviewed:   The following studies were reviewed today:  EKG:  EKG is  ordered today.  The ekg ordered today demonstrates 2nd degree AV block HR 44 bpm.   Recent Labs: No results found for requested labs within last 365 days.  Recent Lipid Panel No results found for: "CHOL", "TRIG", "HDL", "CHOLHDL", "VLDL", "LDLCALC", "LDLDIRECT"  Home Medications   Current Meds  Medication Sig   acetaminophen (TYLENOL) 500 MG tablet Take 1,000 mg by mouth every 6 (six) hours as needed for moderate pain.   atorvastatin (LIPITOR) 80 MG tablet Take 80 mg by mouth daily.   Coenzyme Q10 (CO Q-10) 100 MG CAPS Take 100 mg by mouth daily.   lisinopril-hydrochlorothiazide (ZESTORETIC) 20-25 MG tablet Take 1 tablet by mouth daily.   Multiple Vitamin (MULTIVITAMIN WITH MINERALS) TABS tablet Take 1 tablet by mouth daily.   omeprazole (PRILOSEC) 40 MG capsule Take 40 mg by mouth daily.   tamsulosin (FLOMAX) 0.4 MG CAPS capsule Take 0.4 mg by mouth daily.    Review of Systems      All other systems reviewed and are otherwise negative except as noted above.  Physical Exam    VS:  BP 130/80  Pulse (!) 44   Ht 5' 11"$  (1.803 m)   Wt 191 lb (86.6 kg)   BMI 26.64 kg/m  , BMI Body mass index is 26.64 kg/m.  Wt Readings from Last 3 Encounters:  04/22/22 191 lb (86.6 kg)  07/01/21 205 lb (93 kg)  05/13/21 200 lb (90.7 kg)    GEN: Well nourished, well developed, in no acute distress. HEENT: normal. Neck: Supple, no JVD, carotid bruits, or masses. Cardiac: RRR, no murmurs, rubs, or gallops. No clubbing, cyanosis, edema.  Radials/PT 2+ and equal bilaterally.  Respiratory:  Respirations regular and unlabored, clear to auscultation bilaterally. GI: Soft,  nontender, nondistended. MS: No deformity or atrophy. Skin: Warm and dry, no rash. Neuro:  Strength and sensation are intact. Psych: Normal affect.  Assessment & Plan    Preop clearance - According to the Revised Cardiac Risk Index (RCRI), his Perioperative Risk of Major Cardiac Event is (%): 0.4. His  Functional Capacity in METs is: 5.45 according to the Duke Activity Status Index (DASI). Per AHA/ACC guidelines, he is deemed acceptable risk for the planned procedure without additional cardiovascular testing. Will route to surgical team so they are aware.   Recommend anesthesiology prepare beta agonist in the case he becomes bradycardic with anesthesia. Encouraged and educated reasons to stop fish oil and aspirin prior to surgery. Then may begin back after surgery,    Second degree AV block type 1 - Today's EKG Sinus rhythm with second degree heart block with 2:1 AV conduction with 44 BPM. He is asymptomatic with no lightheadedness, dizziness. Educated to report lightheadedness, dizziness.  OSA -Patient sent CPAP machine back and said he was choking with it and that it is not for him. He is interested in [place]. Will route to Dr. Radford Pax to see if he is candidate.  HTN - BP well controlled. Continue current antihypertensive regimen. Today's BP is 130/80. Patient monitors his blood pressure at home daily. Encouraged to continue monitoring blood pressure and exercising daily.   HLD - Managed per primary care. Encouraged to eat heart healthy diet and continue exercising. Patient exercises at home on his elliptical trainer and works a physical full-time job. He notes myalgias on Atorvastatin 32m dialy, will reduce to 41mdaily and start Zetia 107maily. Return for Lab work in 2 months for fasting lipid panel and CMP  Prediabetes - Continue to follow with PCP. Exercise tolerance limited by orthopedic issues. Encouraged to eat heart healthy diet.        Disposition: Follow up in 4 month(s) with  TifSkeet LatchD or APP.  Signed, CaiLoel DubonnetP 04/22/2022, 8:33 AM [place]

## 2022-04-22 NOTE — Patient Instructions (Signed)
Medication Instructions:  Your physician has recommended you make the following change in your medication:  Stop Aspirin until after your surgery  REDUCE Atorvastatin 55m daily   START: Zetia 152mdaily   *If you need a refill on your cardiac medications before your next appointment, please call your pharmacy*   Lab Work: Please return for Lab work in 2 months for fasting Lipid Panel and CMP. You may come to the...   [facility] (3rd floor) [address] [id number]: 8am-Noon and 1pm-4:30pm  Please ring the doorbell on the small table when you exit the elevator and the Lab Tech will come get you  [facility] [id number]: 8am-1pm, then 2pm-4:30pm   [place] see attached locations sheet stapled to your lab work with address and hours.   If you have labs (blood work) drawn today and your tests are completely normal, you will receive your results only by: My[place]onton Lakeif you have MyChart) OR A paper copy in the mail If you have any lab test that is abnormal or we need to change your treatment, we will call you to review the results.   Testing/Procedures: We will consult the sleep team about the Inspire device for you!    Follow-Up: At [facility] and your health needs are our priority.  As part of our continuing mission to provide you with exceptional heart care, we have created designated Provider Care Teams.  These Care Teams include your primary Cardiologist (physician) and Advanced Practice Providers (APPs -  Physician Assistants and Nurse Practitioners) who all work together to provide you with the care you need, when you need it.  We recommend signing up for the patient portal called "MyChart".  Sign up information is provided on this After Visit Summary.  MyChart is used to connect with patients for Virtual Visits (Telemedicine).   Patients are able to view lab/test results, encounter notes, upcoming appointments, etc.  Non-urgent messages can be sent to your provider as well.   To learn more about what you can do with MyChart, go to [email or web address]   Your next appointment:   4 month(s)  Provider:   TiSkeet LatchMD or CaLaurann MontanaNP    Other Instructions Heart Healthy Diet Recommendations: A low-salt diet is recommended. Meats should be grilled, baked, or boiled. Avoid fried foods. Focus on lean protein sources like fish or chicken with vegetables and fruits. The American Heart Association is a GRMicrobiologist American Heart Association Diet and Lifeystyle Recommendations   Exercise recommendations: The American Heart Association recommends 150 minutes of moderate intensity exercise weekly. Try 30 minutes of moderate intensity exercise 4-5 times per week. This could include walking, jogging, or swimming.

## 2022-05-03 IMAGING — DX DG SHOULDER 1V*L*
1 series · 1 of 1 positions shown · non-contrast
Comparison: 03/06/2018

CLINICAL DATA: Shoulder arthroplasty

EXAM:
LEFT SHOULDER

[shoulder ap]
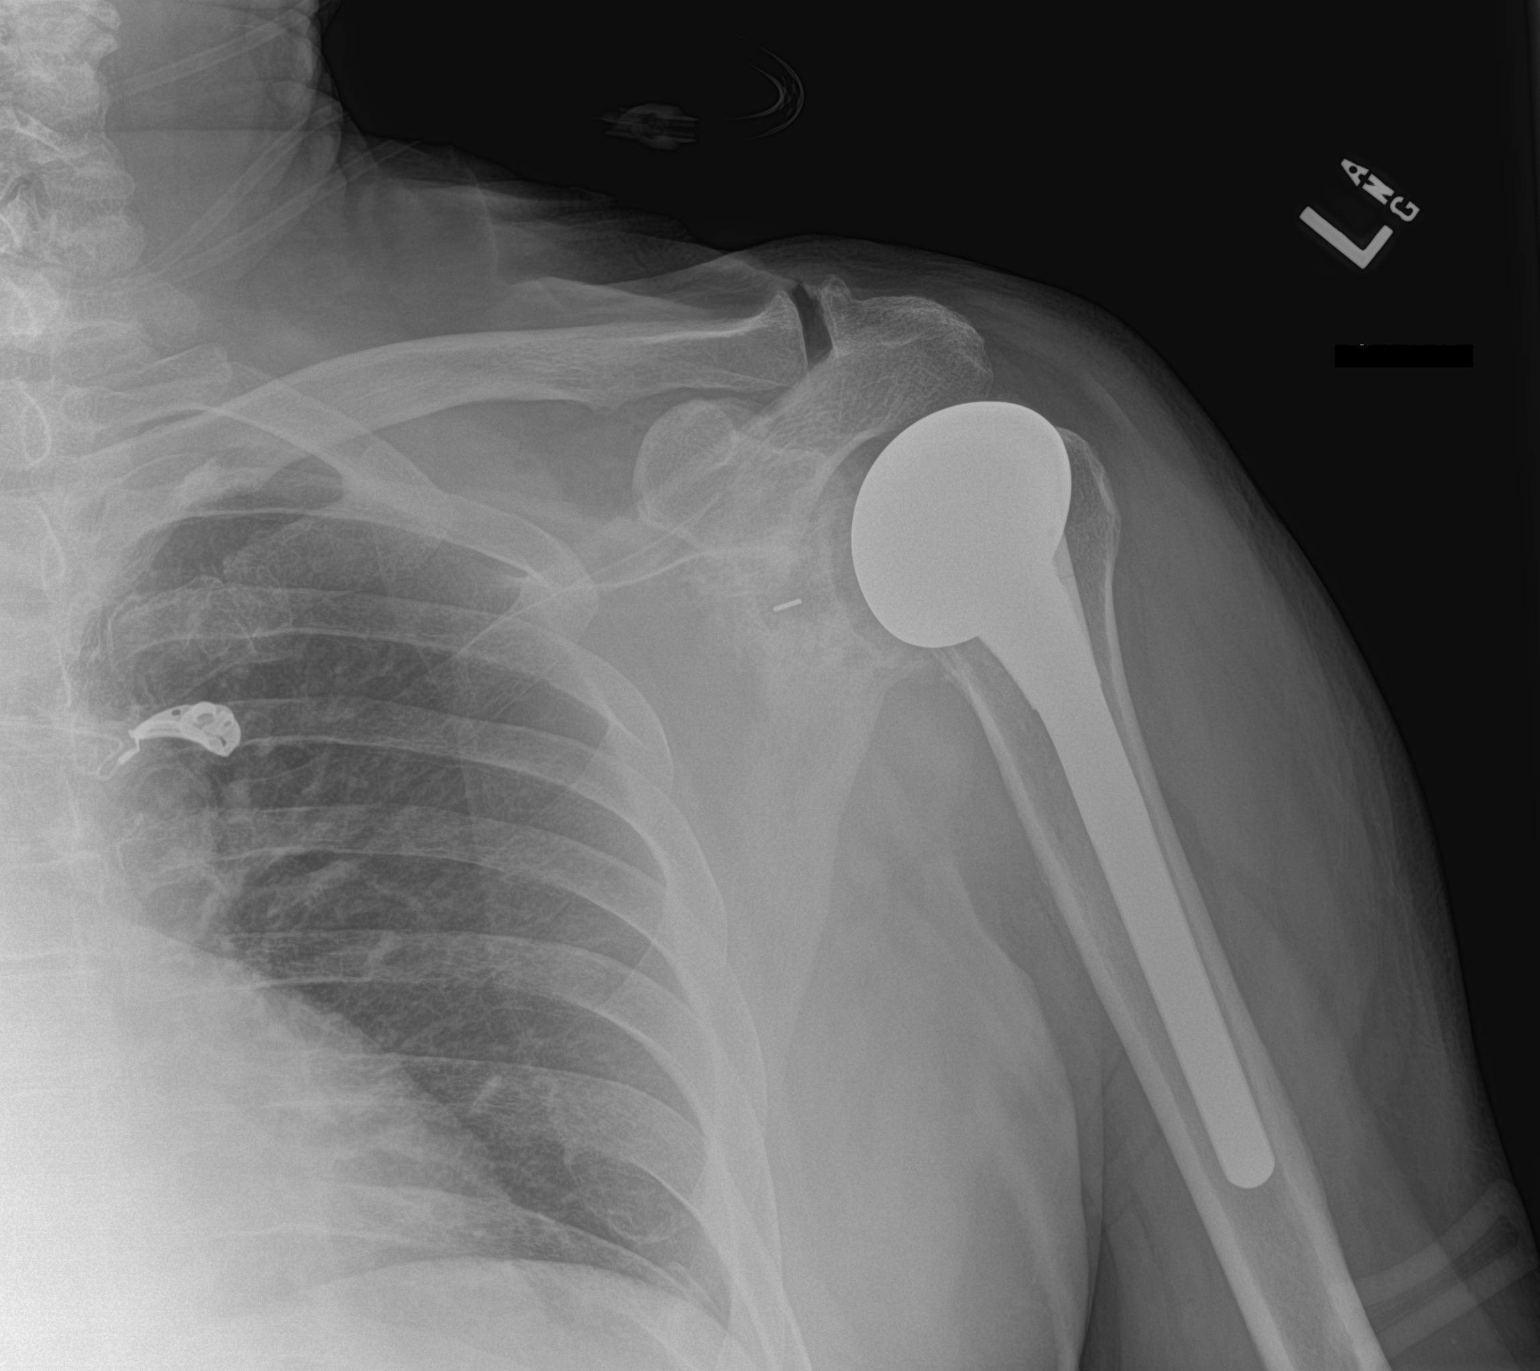

[1 of 1 positions shown; findings below may reference images not displayed]

FINDINGS: Single portable view. Left shoulder arthroplasty changes noted.
Alignment appears anatomic in the frontal plane. Expected
postoperative appearance. No complicating feature. Included left
chest is unremarkable.
IMPRESSION: Expected appearance status post left shoulder arthroplasty. No
complicating features by plain radiography.

## 2022-08-23 ENCOUNTER — Ambulatory Visit (HOSPITAL_BASED_OUTPATIENT_CLINIC_OR_DEPARTMENT_OTHER): Payer: PRIVATE HEALTH INSURANCE | Admitting: Family

## 2022-08-23 NOTE — Progress Notes (Deleted)
  Cardiology Office Note:  .   Date:  08/23/2022  ID:  Glen Carrillo, DOB 1955/12/14, MRN [id number] PCP: Verl Blalock  [facility] Providers Cardiologist:  Chilton Si, MD { Click to update primary MD,subspecialty MD or APP then REFRESH:1}   History of Present Illness: [name] Kitchen   Glen Carrillo is a 67 y.o. male with a hx of arthritis, GERD, HTN, OSA not on CPAP, second degree AV block type I last seen 04/22/22 by Dr. Duke Salvia.   Seen 12/2020 for preoperative clearance after abnormal EKG with Mobitz type 2 second degree AV block with ventricular rate of 48 bpm not on AV nodal blocking agents. Blood pressure was controlled on Lisinopril/HCTZ. He was asymptomatic in regards to heart block with only one episode of syncope 8 years earlier. Carotid duplex revealed minimal blockage in carotid arteries and sleep study revealed  sleep apnea and CPAP titration completed.   Seen 04/22/22 for clearance for right total knee arthroplasty.  Signs of second-degree heart block to 1 AV conduction 45 bpm was asymptomatic and recommended for continued monitoring.  Atorvastatin was reduced to 40 mg daily and Zetia added due to myalgias on higher dose atorvastatin.  It was noted he had returned CPAP due to feeling strangled by the cord.  Right inguinal hernia Cough Knee surgery #  ROS: Please see the history of present illness.    All other systems reviewed and are negative.   Studies Reviewed: [name] Kitchen    EKG:  ***  *** Risk Assessment/Calculations:     No BP recorded.  {Refresh Note OR Click here to enter BP  :1}***       Physical Exam:   VS:  There were no vitals taken for this visit.   Wt Readings from Last 3 Encounters:  04/22/22 191 lb (86.6 kg)  07/01/21 205 lb (93 kg)  05/13/21 200 lb (90.7 kg)    GEN: Well nourished, well developed in no acute distress NECK: No JVD; No carotid bruits CARDIAC: ***RRR, no murmurs, rubs, gallops RESPIRATORY:  Clear to auscultation without rales,  wheezing or rhonchi  ABDOMEN: Soft, non-tender, non-distended EXTREMITIES:  No edema; No deformity   ASSESSMENT AND PLAN: .    Second-degree AV block type I-  OSA-  HTN-  HLD-08/07/2022 LDL 63, HDL 87, triglycerides 60, total cholesterol 162.  Previously managed with 30 mg dose rosuvastatin.  Continue atorvastatin 40 mg daily studies milligrams daily.  Prediabetes- 07/2021 A1c 6.4 ? 01/2022 A1c 6.3 ? 07/2022 A1c 5.7. Continue to follow with PCP.     {Are you ordering a CV Procedure (e.g. stress test, cath, DCCV, TEE, etc)?   Press F2        :[id number]}  Dispo: ***  Signed, Alver Sorrow, NP

## 2022-09-06 ENCOUNTER — Ambulatory Visit (INDEPENDENT_AMBULATORY_CARE_PROVIDER_SITE_OTHER): Payer: Medicare Other | Admitting: Family

## 2022-09-06 ENCOUNTER — Encounter (HOSPITAL_BASED_OUTPATIENT_CLINIC_OR_DEPARTMENT_OTHER): Payer: Self-pay | Admitting: Family

## 2022-09-06 VITALS — BP 122/74 | HR 69 | Ht 71.0 in | Wt 190.0 lb

## 2022-09-06 DIAGNOSIS — I10 Essential (primary) hypertension: Secondary | ICD-10-CM

## 2022-09-06 DIAGNOSIS — I2584 Coronary atherosclerosis due to calcified coronary lesion: Secondary | ICD-10-CM | POA: Diagnosis not present

## 2022-09-06 DIAGNOSIS — E785 Hyperlipidemia, unspecified: Secondary | ICD-10-CM | POA: Diagnosis not present

## 2022-09-06 DIAGNOSIS — I2510 Atherosclerotic heart disease of native coronary artery without angina pectoris: Secondary | ICD-10-CM | POA: Diagnosis not present

## 2022-09-06 DIAGNOSIS — I441 Atrioventricular block, second degree: Secondary | ICD-10-CM

## 2022-09-06 DIAGNOSIS — I700 Atherosclerosis of aorta: Secondary | ICD-10-CM

## 2022-09-06 DIAGNOSIS — G4733 Obstructive sleep apnea (adult) (pediatric): Secondary | ICD-10-CM

## 2022-09-06 DIAGNOSIS — Z01818 Encounter for other preprocedural examination: Secondary | ICD-10-CM

## 2022-09-06 NOTE — Patient Instructions (Addendum)
Medication Instructions:   Discontinue fish oil.   You may hold Aspirin one week prior to shoulder surgery.   *If you need a refill on your cardiac medications before your next appointment, please call your pharmacy*   Lab Work: Your physician recommends that you return for lab work in 3-4 months for fasting lipid panel.   Please return for Lab work. You may come to the...   [facility] (3rd floor) [address] [place], [place] [address]  Open: 8am-Noon and 1pm-4:30pm  Please ring the doorbell on the small table when you exit the elevator and the Lab Tech will come get you  [facility] at [facility] [address], [place], [place] [address] Open: 8am-1pm, then 2pm-4:30pm   [facility]- Please see attached locations sheet stapled to your lab work with address and hours.    If you have labs (blood work) drawn today and your tests are completely normal, you will receive your results only by: MyChart Message (if you have MyChart) OR A paper copy in the mail If you have any lab test that is abnormal or we need to change your treatment, we will call you to review the results.   Testing/Procedures: EKG today was stable compared to previous.    Follow-Up: At [facility], you and your health needs are our priority.  As part of our continuing mission to provide you with exceptional heart care, we have created designated Provider Care Teams.  These Care Teams include your primary Cardiologist (physician) and Advanced Practice Providers (APPs -  Physician Assistants and Nurse Practitioners) who all work together to provide you with the care you need, when you need it.  We recommend signing up for the patient portal called "MyChart".  Sign up information is provided on this After Visit Summary.  MyChart is used to connect with patients for Virtual Visits (Telemedicine).  Patients are able to view lab/test results, encounter notes,  upcoming appointments, etc.  Non-urgent messages can be sent to your provider as well.   To learn more about what you can do with MyChart, go to [email or web address].    Your next appointment:   1 year(s)  Provider:   Chilton Si, MD or Gillian Shields, NP    Other Instructions: We have referred you to Dr. Jenne Pane to discuss [facility] [phone] [address] [place], [place] [address] [phone]

## 2022-09-06 NOTE — Progress Notes (Signed)
Cardiology Office Note:  .   Date:  09/06/2022  ID:  Glen Carrillo, DOB 19-Jul-1955, MRN [id number] PCP: Verl Blalock  [facility] Providers Cardiologist:  Chilton Si, MD    History of Present Illness: [name] Kitchen   Glen Carrillo is a 67 y.o. male  hx of arthritis, GERD, HTN, OSA, second degree AV block type I last seen 04/22/22.   Seen 12/2020 for preoperative clearance after abnormal EKG with Mobitz type 2 second degree AV block with ventricular rate of 48 bpm not on AV nodal blocking agents. Blood pressure was controlled on Lisinopril/HCTZ. He was asymptomatic in regards to heart block with only one episode of syncope 8 years earlier. Carotid duplex revealed minimal blockage in carotid arteries and sleep study revealed  sleep apnea and CPAP titration completed.  Last seen 04/22/22 for preoperative clearance for right total knee arthroplasty with Dr. Lequita Halt. He reported choking at night while wearing CPAP due to the cord on the machine.  He noted myalgias and atorvastatin was reduced to 40 mg daily and Zetia added.  Presents today for follow-up independently. Reports good results from his right knee replacement.  Continues to work full-time in [employer].  Enjoys golfing and hunting in a spare time.  Pending right shoulder surgery in August with Dr. Ranell Patrick.  He also has had hernia surgery since last seen.  BP at recent clinic visits has been 120s-130s/80s-90s.  He has been more active recently and has been working to make dietary changes with successful weight loss and lowering of cholesterol as well as A1c.  Reports no shortness of breath nor dyspnea on exertion. Reports no chest pain, pressure, or tightness. No edema, orthopnea, PND. Reports no palpitations.    ROS: Please see the history of present illness.    All other systems reviewed and are negative.   Studies Reviewed: [name] Kitchen   EKG Interpretation Date/Time:  Friday September 06 2022 15:00:20 EDT Ventricular Rate:   56 PR Interval:    QRS Duration:  78 QT Interval:  394 QTC Calculation: 380 R Axis:   77  Text Interpretation: Stable Sinus rhythm with 2nd degree A-V block (Mobitz I) Confirmed by Gillian Shields ([id number]) on 09/06/2022 3:01:36 PM   Carotid duplex 01/01/21  Summary:  Right Carotid: The extracranial vessels were near-normal with only minimal  wall  thickening or plaque.   Left Carotid: The extracranial vessels were near-normal with only minimal  wall thickening or plaque.   Vertebrals:  Bilateral vertebral arteries demonstrate antegrade flow.  Subclavians: Normal flow hemodynamics were seen in bilateral subclavian  arteries.       Risk Assessment/Calculations:     According to the Revised Cardiac Risk Index (RCRI), his Perioperative Risk of Major Cardiac Event is (%): 0.9  His Functional Capacity in METs is: 7.34 according to the Duke Activity Status Index (DASI).          Physical Exam:   VS:  BP 122/74   Pulse 69   Ht 5\' 11"  (1.803 m)   Wt 190 lb (86.2 kg)   BMI 26.50 kg/m    Wt Readings from Last 3 Encounters:  09/06/22 190 lb (86.2 kg)  04/22/22 191 lb (86.6 kg)  07/01/21 205 lb (93 kg)    GEN: Well nourished, well developed in no acute distress NECK: No JVD; No carotid bruits CARDIAC: bradycardic, RRR, no murmurs, rubs, gallops RESPIRATORY:  Clear to auscultation without rales, wheezing or rhonchi  ABDOMEN: Soft, non-tender, non-distended  EXTREMITIES:  No edema; No deformity   ASSESSMENT AND PLAN: .    Preop clearance - According to the Revised Cardiac Risk Index (RCRI), his Perioperative Risk of Major Cardiac Event is (%): 0.9. His Functional Capacity in METs is: 7.34 according to the Duke Activity Status Index (DASI). Per AHA/ACC guidelines, he is deemed acceptable risk for the planned procedure (shoulder surgery) without additional cardiovascular testing. Will route to surgical team so they are aware.   Recommend anesthesiology prepare beta agonist in the case  he becomes bradycardic with anesthesia. Per office protocol, may hold aspirin 5 to 7 days prior to procedure.   Second degree AV block type 1 - Today's EKG Sinus rhythm with second degree heart block 56 BPM. He is asymptomatic with no lightheadedness, dizziness. Educated to report lightheadedness, dizziness.Stable compared to previous.    OSA -Patient sent CPAP machine back and said he was choking with it and that it is not for him. He is interested in [facility]. Refer to Dr. Jenne Pane ENT for consideration of Inspire.    HTN - BP well controlled. Continue current antihypertensive regimen.   Coronary atherosclerosis on CT / Aortic atherosclerosis /   HLD - Stable with no anginal symptoms. No indication for ischemic evaluation.  07/2022 LDL 63.  Triglycerides 50.  As lipids are so well-controlled we will discontinue fish oil and repeat lipid panel in 3 to 4 months. Encouraged to eat heart healthy diet and continue exercising. Continue Atorvastatin 40mg  daily and start Zetia 10mg  daily.    Prediabetes - Continue to follow with PCP. Exercise tolerance limited by orthopedic issues. Encouraged to eat heart healthy diet. Congratulated on weight loss, dietary changes, and improved A1c.            Dispo: follow up in 1 year with Dr. Duke Salvia or Alver Sorrow, NP   Signed, Alver Sorrow, NP

## 2022-09-18 ENCOUNTER — Telehealth: Payer: Self-pay | Admitting: *Deleted

## 2022-09-18 NOTE — Telephone Encounter (Signed)
   Pre-operative Risk Assessment    Patient Name: Glen Carrillo  DOB: Feb 11, 1956 MRN: [id number]      Request for Surgical Clearance    Procedure:   RIGHT TOTAL SHOULDER ARTHROPLASTY  Date of Surgery:  Clearance TBD                                 Surgeon:  DR. Malon Kindle Surgeon's Group or Practice Name:  [facility] Phone number:  [phone] Fax number:  [phone]   Type of Clearance Requested:   - Medical    Type of Anesthesia:   CHOICE   Additional requests/questions:    [name]   09/18/2022, 4:02 PM

## 2022-09-19 NOTE — Telephone Encounter (Signed)
Hi Tessa,  I put in  my last clinic note that he was cleared for surgery.  Thanks! Alver Sorrow, NP

## 2022-09-20 NOTE — Telephone Encounter (Signed)
   Patient Name: Glen Carrillo  DOB: 17-Dec-1955 MRN: [id number]  Primary Cardiologist: Chilton Si, MD  Chart reviewed as part of pre-operative protocol coverage. Pre-op clearance already addressed by colleagues in earlier phone notes. To summarize recommendations:  -Preop clearance - According to the Revised Cardiac Risk Index (RCRI), his Perioperative Risk of Major Cardiac Event is (%): 0.9. His Functional Capacity in METs is: 7.34 according to the Duke Activity Status Index (DASI). Per AHA/ACC guidelines, he is deemed acceptable risk for the planned procedure (shoulder surgery) without additional cardiovascular testing. Will route to surgical team so they are aware.   Recommend anesthesiology prepare beta agonist in the case he becomes bradycardic with anesthesia. Per office protocol, may hold aspirin 5 to 7 days prior to procedure. -Gillian Shields, NP  Will route this bundled recommendation to requesting provider via Epic fax function and remove from pre-op pool. Please call with questions.  Sharlene Dory, PA-C 09/20/2022, 7:51 AM

## 2022-10-02 NOTE — H&P (Signed)
Patient's anticipated LOS is less than 2 midnights, meeting these requirements: - Younger than 16 - Lives within 1 hour of care - Has a competent adult at home to recover with post-op recover - NO history of  - Chronic pain requiring opiods  - Diabetes  - Coronary Artery Disease  - Heart failure  - Heart attack  - Stroke  - DVT/VTE  - Cardiac arrhythmia  - Respiratory Failure/COPD  - Renal failure  - Anemia  - Advanced Liver disease     Glen Carrillo is an 67 y.o. male.    Chief Complaint: right shoulder pain  HPI: Pt is a 67 y.o. male complaining of right shoulder pain for multiple years. Pain had continually increased since the beginning. X-rays in the clinic show end-stage arthritic changes of the right shoulder. Pt has tried various conservative treatments which have failed to alleviate their symptoms, including injections and therapy. Various options are discussed with the patient. Risks, benefits and expectations were discussed with the patient. Patient understand the risks, benefits and expectations and wishes to proceed with surgery.   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 His smoking use included cigarettes. He has never used smokeless tobacco. He reports current alcohol use. He reports that he  does not use drugs. BMI: Estimated body mass index is 26.5 kg/m as calculated from the following:   Height as of 09/06/22: 5\' 11"  (1.803 m).   Weight as of 09/06/22: 86.2 kg.  No results found for: "ALBUMIN" Diabetes: Patient does not have a diagnosis of diabetes.     Smoking Status:      Allergies:  Allergies  Allergen Reactions   Metformin And Related     swelling    Medications: No current facility-administered medications for this encounter.   Current Outpatient Medications  Medication Sig Dispense Refill   acetaminophen (TYLENOL) 500 MG tablet Take 1,000 mg by mouth every 6 (six) hours as needed for moderate pain.     atorvastatin (LIPITOR) 40 MG tablet Take 1 tablet (40 mg total) by mouth daily. 90 tablet 3   Coenzyme Q10 (CO Q-10) 200 MG CAPS Take 100 mg by mouth daily.     ezetimibe (ZETIA) 10 MG tablet Take 1 tablet (10 mg total) by mouth daily. 90 tablet 3   lisinopril-hydrochlorothiazide (ZESTORETIC) 20-25 MG tablet Take 1 tablet by mouth daily.     Multiple Vitamin (MULTIVITAMIN WITH MINERALS) TABS tablet Take 1 tablet by mouth daily.     omeprazole (PRILOSEC) 40 MG capsule Take 40 mg by mouth daily.     tamsulosin (FLOMAX) 0.4 MG CAPS capsule Take 0.4 mg by mouth daily.      No results found for this or any previous visit (from the past 48 hour(s)). No results  found.  ROS: Pain with rom of the right upper extremity  Physical Exam: Alert and oriented 67 y.o. male in no acute distress Cranial nerves 2-12 intact Cervical spine: full rom with no tenderness, nv intact distally Chest: active breath sounds bilaterally, no wheeze rhonchi or rales Heart: regular rate and rhythm, no murmur Abd: non tender non distended with active bowel sounds Hip is stable with rom  Right shoulder painful rom  Nv intact distally No rashes or edema   Assessment/Plan Assessment: right shoulder end stage osteoarthritis  Plan:  Patient will undergo a right total shoulder by  Dr. Ranell Patrick at [facility] Risks benefits and expectations were discussed with the patient. Patient understand risks, benefits and expectations and wishes to proceed. Preoperative templating of the joint replacement has been completed, documented, and submitted to the Operating Room personnel in order to optimize intra-operative equipment management.   Alphonsa Overall PA-C, MPAS [facility] is now [employer] [address], [address], [place], [place] [address] Phone: [phone] [email or web address] Facebook  [employer]

## 2022-10-10 NOTE — Patient Instructions (Signed)
DUE TO COVID-19 ONLY TWO VISITORS  (aged 68 and older)  ARE ALLOWED TO COME WITH YOU AND STAY IN THE WAITING ROOM ONLY DURING PRE OP AND PROCEDURE.   **NO VISITORS ARE ALLOWED IN THE SHORT STAY AREA OR RECOVERY ROOM!!**  IF YOU WILL BE ADMITTED INTO THE HOSPITAL YOU ARE ALLOWED ONLY FOUR SUPPORT PEOPLE DURING VISITATION HOURS ONLY (7 AM -8PM)   The support person(s) must pass our screening, gel in and out, and wear a mask at all times, including in the patient's room. Patients must also wear a mask when staff or their support person are in the room. Visitors GUEST BADGE MUST BE WORN VISIBLY  One adult visitor may remain with you overnight and MUST be in the room by 8 P.M.     Your procedure is scheduled on: 10/25/22   Report to [facility] Main Entrance    Report to admitting at : 9:30 AM   Call this number if you have problems the morning of surgery [phone]   Do not eat food :After Midnight.   After Midnight you may have the following liquids until : 9:00 AM DAY OF SURGERY  Water Black Coffee (sugar ok, NO MILK/CREAM OR CREAMERS)  Tea (sugar ok, NO MILK/CREAM OR CREAMERS) regular and decaf                             Plain Jell-O (NO RED)                                           Fruit ices (not with fruit pulp, NO RED)                                     Popsicles (NO RED)                                                                  Juice: apple, WHITE grape, WHITE cranberry Sports drinks like Gatorade (NO RED)    The day of surgery:  Drink ONE (1) Pre-Surgery Clear G2 at : 9:00 AM the morning of surgery. Drink in one sitting. Do not sip.  This drink was given to you during your hospital  pre-op appointment visit. Nothing else to drink after completing the  Pre-Surgery Clear Ensure or G2.          If you have questions, please contact your surgeon's office.  FOLLOW ANY ADDITIONAL PRE OP INSTRUCTIONS YOU RECEIVED FROM YOUR SURGEON'S OFFICE!!!   Oral Hygiene  is also important to reduce your risk of infection.                                    Remember - BRUSH YOUR TEETH THE MORNING OF SURGERY WITH YOUR REGULAR TOOTHPASTE  DENTURES WILL BE REMOVED PRIOR TO SURGERY PLEASE DO NOT APPLY "Poly grip" OR ADHESIVES!!!   Do NOT smoke after Midnight   Take these medicines the morning of  surgery with A SIP OF WATER: tamsulosin,omeprazole.Tylenol as needed.  DO NOT TAKE ANY ORAL DIABETIC MEDICATIONS DAY OF YOUR SURGERY  Bring CPAP mask and tubing day of surgery.                              You may not have any metal on your body including hair pins, jewelry, and body piercing             Do not wear lotions, powders, perfumes/cologne, or deodorant              Men may shave face and neck.   Do not bring valuables to the hospital. [facility] IS NOT             RESPONSIBLE   FOR VALUABLES.   Contacts, glasses, or bridgework may not be worn into surgery.   Bring small overnight bag day of surgery.   DO NOT BRING YOUR HOME MEDICATIONS TO THE HOSPITAL. PHARMACY WILL DISPENSE MEDICATIONS LISTED ON YOUR MEDICATION LIST TO YOU DURING YOUR ADMISSION IN THE HOSPITAL!    Patients discharged on the day of surgery will not be allowed to drive home.  Someone NEEDS to stay with you for the first 24 hours after anesthesia.   Special Instructions: Bring a copy of your healthcare power of attorney and living will documents         the day of surgery if you haven't scanned them before.              Please read over the following fact sheets you were given: IF YOU HAVE QUESTIONS ABOUT YOUR PRE-OP INSTRUCTIONS PLEASE CALL [phone]    [place]- Preparing for Total Shoulder Arthroplasty    Before surgery, you can play an important role. Because skin is not sterile, your skin needs to be as free of germs as possible. You can reduce the number of germs on your skin by using the following products. Benzoyl Peroxide Gel Reduces the number of germs present on  the skin Applied twice a day to shoulder area starting two days before surgery    ==================================================================  Please follow these instructions carefully:  BENZOYL PEROXIDE 5% GEL  Please do not use if you have an allergy to benzoyl peroxide.   If your skin becomes reddened/irritated stop using the benzoyl peroxide.  Starting two days before surgery, apply as follows: Apply benzoyl peroxide in the morning and at night. Apply after taking a shower. If you are not taking a shower clean entire shoulder front, back, and side along with the armpit with a clean wet washcloth.  Place a quarter-sized dollop on your shoulder and rub in thoroughly, making sure to cover the front, back, and side of your shoulder, along with the armpit.   2 days before ____ AM   ____ PM              1 day before ____ AM   ____ PM                         Do this twice a day for two days.  (Last application is the night before surgery, AFTER using the CHG soap as described below).  Do NOT apply benzoyl peroxide gel on the day of surgery.  Pre-operative 5 CHG Bath Instructions   You can play a key role in reducing the risk of infection after surgery. Your  skin needs to be as free of germs as possible. You can reduce the number of germs on your skin by washing with CHG (chlorhexidine gluconate) soap before surgery. CHG is an antiseptic soap that kills germs and continues to kill germs even after washing.   DO NOT use if you have an allergy to chlorhexidine/CHG or antibacterial soaps. If your skin becomes reddened or irritated, stop using the CHG and notify one of our RNs at : [phone].   Please shower with the CHG soap starting 4 days before surgery using the following schedule:     Please keep in mind the following:  DO NOT shave, including legs and underarms, starting the day of your first shower.   You may shave your face at any point before/day of surgery.  Place  clean sheets on your bed the day you start using CHG soap. Use a clean washcloth (not used since being washed) for each shower. DO NOT sleep with pets once you start using the CHG.   CHG Shower Instructions:  If you choose to wash your hair and private area, wash first with your normal shampoo/soap.  After you use shampoo/soap, rinse your hair and body thoroughly to remove shampoo/soap residue.  Turn the water OFF and apply about 3 tablespoons (45 ml) of CHG soap to a CLEAN washcloth.  Apply CHG soap ONLY FROM YOUR NECK DOWN TO YOUR TOES (washing for 3-5 minutes)  DO NOT use CHG soap on face, private areas, open wounds, or sores.  Pay special attention to the area where your surgery is being performed.  If you are having back surgery, having someone wash your back for you may be helpful. Wait 2 minutes after CHG soap is applied, then you may rinse off the CHG soap.  Pat dry with a clean towel  Put on clean clothes/pajamas   If you choose to wear lotion, please use ONLY the CHG-compatible lotions on the back of this paper.     Additional instructions for the day of surgery: DO NOT APPLY any lotions, deodorants, cologne, or perfumes.   Put on clean/comfortable clothes.  Brush your teeth.  Ask your nurse before applying any prescription medications to the skin.      CHG Compatible Lotions   Aveeno Moisturizing lotion  Cetaphil Moisturizing Cream  Cetaphil Moisturizing Lotion  Clairol Herbal Essence Moisturizing Lotion, Dry Skin  Clairol Herbal Essence Moisturizing Lotion, Extra Dry Skin  Clairol Herbal Essence Moisturizing Lotion, Normal Skin  Curel Age Defying Therapeutic Moisturizing Lotion with Alpha Hydroxy  Curel Extreme Care Body Lotion  Curel Soothing Hands Moisturizing Hand Lotion  Curel Therapeutic Moisturizing Cream, Fragrance-Free  Curel Therapeutic Moisturizing Lotion, Fragrance-Free  Curel Therapeutic Moisturizing Lotion, Original Formula  Eucerin Daily Replenishing  Lotion  Eucerin Dry Skin Therapy Plus Alpha Hydroxy Crme  Eucerin Dry Skin Therapy Plus Alpha Hydroxy Lotion  Eucerin Original Crme  Eucerin Original Lotion  Eucerin Plus Crme Eucerin Plus Lotion  Eucerin TriLipid Replenishing Lotion  Keri Anti-Bacterial Hand Lotion  Keri Deep Conditioning Original Lotion Dry Skin Formula Softly Scented  Keri Deep Conditioning Original Lotion, Fragrance Free Sensitive Skin Formula  Keri Lotion Fast Absorbing Fragrance Free Sensitive Skin Formula  Keri Lotion Fast Absorbing Softly Scented Dry Skin Formula  Keri Original Lotion  Keri Skin Renewal Lotion Keri Silky Smooth Lotion  Keri Silky Smooth Sensitive Skin Lotion  Nivea Body Creamy Conditioning Oil  Nivea Body Extra Enriched Building services engineer  Sheer Moisturizing Lotion Nivea Crme  Nivea Skin Firming Lotion  NutraDerm 30 Skin Lotion  NutraDerm Skin Lotion  NutraDerm Therapeutic Skin Cream  NutraDerm Therapeutic Skin Lotion  ProShield Protective Hand Cream  Provon moisturizing lotion   Incentive Spirometer  An incentive spirometer is a tool that can help keep your lungs clear and active. This tool measures how well you are filling your lungs with each breath. Taking long deep breaths may help reverse or decrease the chance of developing breathing (pulmonary) problems (especially infection) following: A long period of time when you are unable to move or be active. BEFORE THE PROCEDURE  If the spirometer includes an indicator to show your best effort, your nurse or respiratory therapist will set it to a desired goal. If possible, sit up straight or lean slightly forward. Try not to slouch. Hold the incentive spirometer in an upright position. INSTRUCTIONS FOR USE  Sit on the edge of your bed if possible, or sit up as far as you can in bed or on a chair. Hold the incentive spirometer in an upright position. Breathe out normally. Place the mouthpiece in your mouth  and seal your lips tightly around it. Breathe in slowly and as deeply as possible, raising the piston or the ball toward the top of the column. Hold your breath for 3-5 seconds or for as long as possible. Allow the piston or ball to fall to the bottom of the column. Remove the mouthpiece from your mouth and breathe out normally. Rest for a few seconds and repeat Steps 1 through 7 at least 10 times every 1-2 hours when you are awake. Take your time and take a few normal breaths between deep breaths. The spirometer may include an indicator to show your best effort. Use the indicator as a goal to work toward during each repetition. After each set of 10 deep breaths, practice coughing to be sure your lungs are clear. If you have an incision (the cut made at the time of surgery), support your incision when coughing by placing a pillow or rolled up towels firmly against it. Once you are able to get out of bed, walk around indoors and cough well. You may stop using the incentive spirometer when instructed by your caregiver.  RISKS AND COMPLICATIONS Take your time so you do not get dizzy or light-headed. If you are in pain, you may need to take or ask for pain medication before doing incentive spirometry. It is harder to take a deep breath if you are having pain. AFTER USE Rest and breathe slowly and easily. It can be helpful to keep track of a log of your progress. Your caregiver can provide you with a simple table to help with this. If you are using the spirometer at home, follow these instructions: SEEK MEDICAL CARE IF:  You are having difficultly using the spirometer. You have trouble using the spirometer as often as instructed. Your pain medication is not giving enough relief while using the spirometer. You develop fever of 100.5 F (38.1 C) or higher. SEEK IMMEDIATE MEDICAL CARE IF:  You cough up bloody sputum that had not been present before. You develop fever of 102 F (38.9 C) or  greater. You develop worsening pain at or near the incision site. MAKE SURE YOU:  Understand these instructions. Will watch your condition. Will get help right away if you are not doing well or get worse. Document Released: 07/08/2006 Document Revised: 05/20/2011 Document Reviewed: 09/08/2006 ExitCare Patient Information 2014  [employer].   ________________________________________________________________________

## 2022-10-14 ENCOUNTER — Other Ambulatory Visit: Payer: Self-pay

## 2022-10-14 ENCOUNTER — Encounter (HOSPITAL_COMMUNITY): Payer: Self-pay

## 2022-10-14 ENCOUNTER — Encounter (HOSPITAL_COMMUNITY): Admission: RE | Admit: 2022-10-14 | Payer: Medicare Other | Source: Ambulatory Visit

## 2022-10-14 VITALS — BP 147/89 | HR 65 | Temp 98.3°F | Ht 71.0 in | Wt 195.0 lb

## 2022-10-14 DIAGNOSIS — I10 Essential (primary) hypertension: Secondary | ICD-10-CM | POA: Insufficient documentation

## 2022-10-14 DIAGNOSIS — Z01812 Encounter for preprocedural laboratory examination: Secondary | ICD-10-CM | POA: Insufficient documentation

## 2022-10-14 DIAGNOSIS — Z01818 Encounter for other preprocedural examination: Secondary | ICD-10-CM

## 2022-10-14 LAB — CBC
HCT: 37.6 % — ABNORMAL LOW (ref 39.0–52.0)
Hemoglobin: 12.5 g/dL — ABNORMAL LOW (ref 13.0–17.0)
MCH: 33 pg (ref 26.0–34.0)
MCHC: 33.2 g/dL (ref 30.0–36.0)
MCV: 99.2 fL (ref 80.0–100.0)
Platelets: 199 10*3/uL (ref 150–400)
RBC: 3.79 MIL/uL — ABNORMAL LOW (ref 4.22–5.81)
RDW: 12.9 % (ref 11.5–15.5)
WBC: 9.3 10*3/uL (ref 4.0–10.5)
nRBC: 0 % (ref 0.0–0.2)

## 2022-10-14 LAB — BASIC METABOLIC PANEL
Anion gap: 11 (ref 5–15)
BUN: 27 mg/dL — ABNORMAL HIGH (ref 8–23)
CO2: 25 mmol/L (ref 22–32)
Calcium: 9.1 mg/dL (ref 8.9–10.3)
Chloride: 102 mmol/L (ref 98–111)
Creatinine, Ser: 1.18 mg/dL (ref 0.61–1.24)
GFR, Estimated: >60 mL/min (ref >60)
Glucose, Bld: 96 mg/dL (ref 70–99)
Potassium: 4.2 mmol/L (ref 3.5–5.1)
Sodium: 138 mmol/L (ref 135–145)

## 2022-10-14 LAB — SURGICAL PCR SCREEN
MRSA, PCR: NEGATIVE
Staphylococcus aureus: NEGATIVE

## 2022-10-14 NOTE — Progress Notes (Signed)
For Short Stay: COVID SWAB appointment date:  Bowel Prep reminder:   For Anesthesia: PCP - Sheliah Hatch, PA-C Cardiologist - Chilton Si, MD  Clearance: Jari Favre: PAC: 09/20/22 Chest x-ray -  EKG - 09/06/22 Stress Test -  ECHO -  Cardiac Cath -  Pacemaker/ICD device last checked: Pacemaker orders received: Device Rep notified:  Spinal Cord Stimulator: N/A  Sleep Study - Yes CPAP - NO  Fasting Blood Sugar - N/A Checks Blood Sugar _____ times a day Date and result of last Hgb A1c- 5.9: 09/2022  Last dose of GLP1 agonist- N/A GLP1 instructions:   Last dose of SGLT-2 inhibitors- N/A SGLT-2 instructions:   Blood Thinner Instructions: Aspirin Instructions: To hold it after 10/18/22 Last Dose:  Activity level: Can go up a flight of stairs and activities of daily living without stopping and without chest pain and/or shortness of breath   Able to exercise without chest pain and/or shortness of breath  Anesthesia review: Hx: HTN,Second degree AV block,Pre-DIA,OSA(NO CPAP)  Patient denies shortness of breath, fever, cough and chest pain at PAT appointment   Patient verbalized understanding of instructions that were given to them at the PAT appointment. Patient was also instructed that they will need to review over the PAT instructions again at home before surgery.

## 2022-10-18 ENCOUNTER — Encounter (HOSPITAL_COMMUNITY): Payer: Self-pay

## 2022-10-21 NOTE — Progress Notes (Signed)
Anesthesia Chart Review   Case: [id number] Date/Time: 10/25/22 1145   Procedure: TOTAL SHOULDER ARTHROPLASTY (Right: Shoulder) - interscalene block  120   Anesthesia type: Choice   Pre-op diagnosis: Right shoulder endstage osteoarthritis   Location: [facility] ROOM 07 / [facility]   Surgeons: Beverely Low, MD       DISCUSSION:67 y.o. former smoker with h/o HTN, OSA, second degree AV block type I, right shoulder OA scheduled for above procedure 10/25/2022 with Dr. Beverely Low.   Per cardiology preoperative evaluation 09/20/2022, "-Preop clearance - According to the Revised Cardiac Risk Index (RCRI), his Perioperative Risk of Major Cardiac Event is (%): 0.9. His Functional Capacity in METs is: 7.34 according to the Duke Activity Status Index (DASI). Per AHA/ACC guidelines, he is deemed acceptable risk for the planned procedure (shoulder surgery) without additional cardiovascular testing. Will route to surgical team so they are aware.   Recommend anesthesiology prepare beta agonist in the case he becomes bradycardic with anesthesia. Per office protocol, may hold aspirin 5 to 7 days prior to procedure."  VS: BP (!) 147/89   Pulse 65   Temp 36.8 C (Oral)   Ht 5\' 11"  (1.803 m)   Wt 88.5 kg   SpO2 100%   BMI 27.20 kg/m   PROVIDERS: Sheliah Hatch, PA-C is PCP   Cardiologist:  Chilton Si, MD  LABS: Labs reviewed: Acceptable for surgery. (all labs ordered are listed, but only abnormal results are displayed)  Labs Reviewed  BASIC METABOLIC PANEL - Abnormal; Notable for the following components:      Result Value   BUN 27 (*)    All other components within normal limits  CBC - Abnormal; Notable for the following components:   RBC 3.79 (*)    Hemoglobin 12.5 (*)    HCT 37.6 (*)    All other components within normal limits  SURGICAL PCR SCREEN     IMAGES:   EKG:   CV:  Past Medical History:  Diagnosis Date   Arthritis    Essential hypertension 12/25/2020   GERD  (gastroesophageal reflux disease)    Hypertension    Pre-diabetes    Pure hypercholesterolemia 12/25/2020   Second degree atrioventricular block, Mobitz (type) I 12/25/2020    Past Surgical History:  Procedure Laterality Date   ARTHROSCOPY KNEE W/ DRILLING Left    KNEE ARTHROSCOPY Bilateral    TOTAL SHOULDER ARTHROPLASTY Left 03/16/2021   Procedure: TOTAL SHOULDER ARTHROPLASTY;  Surgeon: Beverely Low, MD;  Location: [facility];  Service: Orthopedics;  Laterality: Left;  with ISB   WISDOM TOOTH EXTRACTION      MEDICATIONS:  acetaminophen (TYLENOL) 500 MG tablet   aspirin EC 81 MG tablet   atorvastatin (LIPITOR) 40 MG tablet   Coenzyme Q10 (CO Q-10) 200 MG CAPS   cyanocobalamin (VITAMIN B12) 1000 MCG tablet   ezetimibe (ZETIA) 10 MG tablet   lisinopril-hydrochlorothiazide (ZESTORETIC) 20-25 MG tablet   Multiple Vitamin (MULTIVITAMIN WITH MINERALS) TABS tablet   omeprazole (PRILOSEC) 40 MG capsule   sildenafil (VIAGRA) 100 MG tablet   tamsulosin (FLOMAX) 0.4 MG CAPS capsule   No current facility-administered medications for this encounter.    Jodell Cipro Ward, PA-C [facility]Surgical Testing [phone]

## 2022-10-24 NOTE — Anesthesia Preprocedure Evaluation (Addendum)
Anesthesia Evaluation  Patient identified by MRN, date of birth, ID band Patient awake    Reviewed: Allergy & Precautions, NPO status , Patient's Chart, lab work & pertinent test results  Airway Mallampati: II  TM Distance: >3 FB Neck ROM: Full    Dental no notable dental hx. (+) Teeth Intact, Dental Advisory Given   Pulmonary sleep apnea , former smoker   Pulmonary exam normal breath sounds clear to auscultation       Cardiovascular hypertension, Pt. on medications Normal cardiovascular exam+ dysrhythmias  Rhythm:Regular Rate:Normal     Neuro/Psych negative neurological ROS  negative psych ROS   GI/Hepatic Neg liver ROS,GERD  Medicated and Controlled,,  Endo/Other  negative endocrine ROS    Renal/GU Lab Results      Component                Value               Date                        K                        4.2                 10/14/2022                BUN                      27 (H)              10/14/2022                CREATININE               1.18                10/14/2022                      Musculoskeletal  (+) Arthritis , Osteoarthritis,    Abdominal   Peds  Hematology Lab Results      Component                Value               Date                      WBC                      9.3                 10/14/2022                HGB                      12.5 (L)            10/14/2022                HCT                      37.6 (L)            10/14/2022                MCV  99.2                10/14/2022                PLT                      199                 10/14/2022              Anesthesia Other Findings All: metformin  Reproductive/Obstetrics                             Anesthesia Physical Anesthesia Plan  ASA: 2  Anesthesia Plan: General and Regional   Post-op Pain Management: Regional block* and Minimal or no pain anticipated   Induction:  Intravenous  PONV Risk Score and Plan: 3 and Treatment may vary due to age or medical condition, Midazolam, Ondansetron and Dexamethasone  Airway Management Planned: Oral ETT  Additional Equipment: None  Intra-op Plan:   Post-operative Plan: Extubation in OR  Informed Consent: I have reviewed the patients History and Physical, chart, labs and discussed the procedure including the risks, benefits and alternatives for the proposed anesthesia with the patient or authorized representative who has indicated his/her understanding and acceptance.     Dental advisory given  Plan Discussed with: CRNA  Anesthesia Plan Comments: (GA w R ISB w exparel)        Anesthesia Quick Evaluation

## 2022-10-25 ENCOUNTER — Ambulatory Visit (HOSPITAL_COMMUNITY): Payer: Medicare Other

## 2022-10-25 ENCOUNTER — Ambulatory Visit (HOSPITAL_COMMUNITY): Payer: Medicare Other | Admitting: Anesthesiology

## 2022-10-25 ENCOUNTER — Ambulatory Visit (HOSPITAL_COMMUNITY)
Admission: RE | Admit: 2022-10-25 | Discharge: 2022-10-25 | Disposition: A | Discharge status: Home / Self Care | Payer: Medicare Other | Source: Ambulatory Visit | Attending: Orthopedic Surgery | Admitting: Orthopedic Surgery

## 2022-10-25 ENCOUNTER — Encounter (HOSPITAL_COMMUNITY): Payer: Self-pay | Admitting: Orthopedic Surgery

## 2022-10-25 ENCOUNTER — Ambulatory Visit (HOSPITAL_COMMUNITY): Payer: Medicare Other | Admitting: Medical

## 2022-10-25 ENCOUNTER — Encounter (HOSPITAL_COMMUNITY)
Admission: RE | Disposition: A | Discharge status: Home / Self Care | Payer: Self-pay | Source: Ambulatory Visit | Attending: Orthopedic Surgery

## 2022-10-25 DIAGNOSIS — K219 Gastro-esophageal reflux disease without esophagitis: Secondary | ICD-10-CM | POA: Insufficient documentation

## 2022-10-25 DIAGNOSIS — I10 Essential (primary) hypertension: Secondary | ICD-10-CM | POA: Insufficient documentation

## 2022-10-25 DIAGNOSIS — M19011 Primary osteoarthritis, right shoulder: Secondary | ICD-10-CM | POA: Diagnosis not present

## 2022-10-25 DIAGNOSIS — G4730 Sleep apnea, unspecified: Secondary | ICD-10-CM | POA: Insufficient documentation

## 2022-10-25 DIAGNOSIS — Z87891 Personal history of nicotine dependence: Secondary | ICD-10-CM | POA: Diagnosis not present

## 2022-10-25 DIAGNOSIS — Z79899 Other long term (current) drug therapy: Secondary | ICD-10-CM | POA: Insufficient documentation

## 2022-10-25 HISTORY — PX: TOTAL SHOULDER ARTHROPLASTY: SHX126

## 2022-10-25 SURGERY — ARTHROPLASTY, SHOULDER, TOTAL
Anesthesia: Regional | Site: Shoulder | Laterality: Right

## 2022-10-25 MED ORDER — ONDANSETRON HCL 4 MG PO TABS: 4.0000 mg | ORAL_TABLET | Freq: Three times a day (TID) | ORAL | 1 refills | Status: DC | PRN

## 2022-10-25 MED ORDER — 0.9 % SODIUM CHLORIDE (POUR BTL) OPTIME
TOPICAL | Status: DC | PRN
  Administered 2022-10-25: 1000 mL

## 2022-10-25 MED ORDER — SUGAMMADEX SODIUM 200 MG/2ML IV SOLN
INTRAVENOUS | Status: DC | PRN
  Administered 2022-10-25: 200 mg via INTRAVENOUS

## 2022-10-25 MED ORDER — CHLORHEXIDINE GLUCONATE 0.12 % MT SOLN
15.0000 mL | Freq: Once | OROMUCOSAL | Status: AC
  Administered 2022-10-25: 15 mL via OROMUCOSAL

## 2022-10-25 MED ORDER — LACTATED RINGERS IV SOLN: INTRAVENOUS | Status: DC

## 2022-10-25 MED ORDER — CEFAZOLIN SODIUM-DEXTROSE 2-4 GM/100ML-% IV SOLN
2.0000 g | INTRAVENOUS | Status: AC
  Administered 2022-10-25: 2 g via INTRAVENOUS
  Filled 2022-10-25: qty 100

## 2022-10-25 MED ORDER — ROCURONIUM BROMIDE 10 MG/ML (PF) SYRINGE
PREFILLED_SYRINGE | INTRAVENOUS | Status: DC | PRN
  Administered 2022-10-25: 80 mg via INTRAVENOUS

## 2022-10-25 MED ORDER — LIDOCAINE 2% (20 MG/ML) 5 ML SYRINGE
INTRAMUSCULAR | Status: DC | PRN
  Administered 2022-10-25: 100 mg via INTRAVENOUS

## 2022-10-25 MED ORDER — THROMBIN (RECOMBINANT) 5000 UNITS EX SOLR
CUTANEOUS | Status: AC
  Filled 2022-10-25: qty 5000

## 2022-10-25 MED ORDER — THROMBIN 5000 UNITS EX SOLR
CUTANEOUS | Status: DC | PRN
  Administered 2022-10-25: 5000 [IU] via TOPICAL

## 2022-10-25 MED ORDER — PROPOFOL 10 MG/ML IV BOLUS
INTRAVENOUS | Status: DC | PRN
  Administered 2022-10-25: 180 mg via INTRAVENOUS

## 2022-10-25 MED ORDER — HYDROCODONE-ACETAMINOPHEN 5-325 MG PO TABS: 1.0000 | ORAL_TABLET | Freq: Four times a day (QID) | ORAL | 0 refills | Status: DC | PRN

## 2022-10-25 MED ORDER — ROCURONIUM BROMIDE 10 MG/ML (PF) SYRINGE
PREFILLED_SYRINGE | INTRAVENOUS | Status: AC
  Filled 2022-10-25: qty 10

## 2022-10-25 MED ORDER — STERILE WATER FOR IRRIGATION IR SOLN
Status: DC | PRN
  Administered 2022-10-25: 2000 mL

## 2022-10-25 MED ORDER — BUPIVACAINE HCL (PF) 0.5 % IJ SOLN
INTRAMUSCULAR | Status: DC | PRN
  Administered 2022-10-25: 15 mL via PERINEURAL

## 2022-10-25 MED ORDER — ORAL CARE MOUTH RINSE: 15.0000 mL | Freq: Once | OROMUCOSAL | Status: AC

## 2022-10-25 MED ORDER — ONDANSETRON HCL 4 MG/2ML IJ SOLN
INTRAMUSCULAR | Status: AC
  Filled 2022-10-25: qty 2

## 2022-10-25 MED ORDER — LIDOCAINE HCL (PF) 2 % IJ SOLN
INTRAMUSCULAR | Status: AC
  Filled 2022-10-25: qty 5

## 2022-10-25 MED ORDER — BUPIVACAINE-EPINEPHRINE (PF) 0.25% -1:200000 IJ SOLN
INTRAMUSCULAR | Status: DC | PRN
  Administered 2022-10-25: 12 mL

## 2022-10-25 MED ORDER — DEXAMETHASONE SODIUM PHOSPHATE 10 MG/ML IJ SOLN
INTRAMUSCULAR | Status: DC | PRN
  Administered 2022-10-25: 10 mg via INTRAVENOUS

## 2022-10-25 MED ORDER — TRANEXAMIC ACID-NACL 1000-0.7 MG/100ML-% IV SOLN
1000.0000 mg | INTRAVENOUS | Status: AC
  Administered 2022-10-25: 1000 mg via INTRAVENOUS
  Filled 2022-10-25: qty 100

## 2022-10-25 MED ORDER — FENTANYL CITRATE PF 50 MCG/ML IJ SOSY
50.0000 ug | PREFILLED_SYRINGE | INTRAMUSCULAR | Status: DC
  Administered 2022-10-25: 50 ug via INTRAVENOUS
  Filled 2022-10-25: qty 2

## 2022-10-25 MED ORDER — DEXAMETHASONE SODIUM PHOSPHATE 10 MG/ML IJ SOLN
INTRAMUSCULAR | Status: AC
  Filled 2022-10-25: qty 1

## 2022-10-25 MED ORDER — PHENYLEPHRINE HCL-NACL 20-0.9 MG/250ML-% IV SOLN
INTRAVENOUS | Status: DC | PRN
  Administered 2022-10-25: 50 ug/min via INTRAVENOUS

## 2022-10-25 MED ORDER — FENTANYL CITRATE (PF) 100 MCG/2ML IJ SOLN
INTRAMUSCULAR | Status: DC | PRN
  Administered 2022-10-25: 50 ug via INTRAVENOUS

## 2022-10-25 MED ORDER — BUPIVACAINE-EPINEPHRINE 0.25% -1:200000 IJ SOLN
INTRAMUSCULAR | Status: AC
  Filled 2022-10-25: qty 1

## 2022-10-25 MED ORDER — ONDANSETRON HCL 4 MG/2ML IJ SOLN
INTRAMUSCULAR | Status: DC | PRN
  Administered 2022-10-25: 4 mg via INTRAVENOUS

## 2022-10-25 MED ORDER — MIDAZOLAM HCL 2 MG/2ML IJ SOLN
1.0000 mg | INTRAMUSCULAR | Status: DC
  Administered 2022-10-25: 2 mg via INTRAVENOUS
  Filled 2022-10-25: qty 2

## 2022-10-25 MED ORDER — FENTANYL CITRATE (PF) 100 MCG/2ML IJ SOLN
INTRAMUSCULAR | Status: AC
  Filled 2022-10-25: qty 2

## 2022-10-25 MED ORDER — BUPIVACAINE LIPOSOME 1.3 % IJ SUSP
INTRAMUSCULAR | Status: DC | PRN
  Administered 2022-10-25: 10 mL via PERINEURAL

## 2022-10-25 MED ORDER — METHOCARBAMOL 500 MG PO TABS: 500.0000 mg | ORAL_TABLET | Freq: Three times a day (TID) | ORAL | 1 refills | Status: DC | PRN

## 2022-10-25 SURGICAL SUPPLY — 60 items
AID PSTN UNV HD RSTRNT DISP (MISCELLANEOUS) ×1
BAG COUNTER SPONGE SURGICOUNT (BAG) IMPLANT
BAG SPEC THK2 15X12 ZIP CLS (MISCELLANEOUS)
BAG SPNG CNTER NS LX DISP (BAG)
BAG ZIPLOCK 12X15 (MISCELLANEOUS) IMPLANT
BIT DRILL 1.6MX128 (BIT) ×1 IMPLANT
BLADE SAG 18X100X1.27 (BLADE) ×1 IMPLANT
BODY PROX SHOULDER SZ 14-135 (Shoulder) IMPLANT
CEMENT HV SMART SET (Cement) ×1 IMPLANT
CLSR STERI-STRIP ANTIMIC 1/2X4 (GAUZE/BANDAGES/DRESSINGS) IMPLANT
COVER BACK TABLE 60X90IN (DRAPES) ×1 IMPLANT
COVER SURGICAL LIGHT HANDLE (MISCELLANEOUS) ×1 IMPLANT
DRAPE INCISE IOBAN 66X45 STRL (DRAPES) ×1 IMPLANT
DRAPE ORTHO SPLIT 77X108 STRL (DRAPES) ×2
DRAPE SHEET LG 3/4 BI-LAMINATE (DRAPES) ×1 IMPLANT
DRAPE SURG ORHT 6 SPLT 77X108 (DRAPES) ×2 IMPLANT
DRAPE U-SHAPE 47X51 STRL (DRAPES) ×1 IMPLANT
DRSG ADAPTIC 3X8 NADH LF (GAUZE/BANDAGES/DRESSINGS) ×1 IMPLANT
DURAPREP 26ML APPLICATOR (WOUND CARE) ×1 IMPLANT
ELECT BLADE TIP CTD 4 INCH (ELECTRODE) ×1 IMPLANT
ELECT NDL TIP 2.8 STRL (NEEDLE) ×1 IMPLANT
ELECT NEEDLE TIP 2.8 STRL (NEEDLE) ×1 IMPLANT
ELECT REM PT RETURN 15FT ADLT (MISCELLANEOUS) ×1 IMPLANT
FACESHIELD WRAPAROUND (MASK) ×2 IMPLANT
FACESHIELD WRAPAROUND OR TEAM (MASK) ×2 IMPLANT
GAUZE PAD ABD 8X10 STRL (GAUZE/BANDAGES/DRESSINGS) ×1 IMPLANT
GAUZE SPONGE 4X4 12PLY STRL (GAUZE/BANDAGES/DRESSINGS) ×1 IMPLANT
GLENOID ANCHOR PEG CROSSLK 52 (Orthopedic Implant) IMPLANT
GLOVE BIOGEL PI IND STRL 7.5 (GLOVE) ×1 IMPLANT
GLOVE BIOGEL PI IND STRL 8.5 (GLOVE) ×1 IMPLANT
GLOVE ORTHO TXT STRL SZ7.5 (GLOVE) ×1 IMPLANT
GLOVE SURG ORTHO 8.5 STRL (GLOVE) ×1 IMPLANT
GOWN STRL REUS W/ TWL XL LVL3 (GOWN DISPOSABLE) ×2 IMPLANT
GOWN STRL REUS W/TWL XL LVL3 (GOWN DISPOSABLE) ×2
HEAD HUMERAL ECCEN 52MX21M (Head) IMPLANT
KIT BASIN OR (CUSTOM PROCEDURE TRAY) ×1 IMPLANT
KIT TURNOVER KIT A (KITS) IMPLANT
MANIFOLD NEPTUNE II (INSTRUMENTS) ×1 IMPLANT
NDL MAYO CATGUT SZ4 TPR NDL (NEEDLE) ×1 IMPLANT
NEEDLE MAYO CATGUT SZ4 (NEEDLE) ×1 IMPLANT
PACK SHOULDER (CUSTOM PROCEDURE TRAY) ×1 IMPLANT
PIN METAGLENE 2.5 (PIN) IMPLANT
RESTRAINT HEAD UNIVERSAL NS (MISCELLANEOUS) ×1 IMPLANT
SLING ARM FOAM STRAP LRG (SOFTGOODS) ×1 IMPLANT
SMARTMIX MINI TOWER (MISCELLANEOUS) ×1
SPIKE FLUID TRANSFER (MISCELLANEOUS) ×1 IMPLANT
SPONGE SURGIFOAM ABS GEL 12-7 (HEMOSTASIS) ×1 IMPLANT
SPONGE T-LAP 4X18 ~~LOC~~+RFID (SPONGE) ×2 IMPLANT
STEM GLOBAL UNITE 14MM PORO 11 (Joint) IMPLANT
STRIP CLOSURE SKIN 1/2X4 (GAUZE/BANDAGES/DRESSINGS) ×1 IMPLANT
SUCTION TUBE FRAZIER 12FR DISP (SUCTIONS) ×1 IMPLANT
SUT FIBERWIRE #2 38 T-5 BLUE (SUTURE) ×4
SUT MNCRL AB 4-0 PS2 18 (SUTURE) ×1 IMPLANT
SUT VIC AB 0 CT1 36 (SUTURE) ×1 IMPLANT
SUT VIC AB 0 CT2 27 (SUTURE) ×1 IMPLANT
SUT VIC AB 2-0 CT1 27 (SUTURE) ×1
SUT VIC AB 2-0 CT1 TAPERPNT 27 (SUTURE) ×1 IMPLANT
SUTURE FIBERWR #2 38 T-5 BLUE (SUTURE) ×4 IMPLANT
TOWEL OR 17X26 10 PK STRL BLUE (TOWEL DISPOSABLE) ×1 IMPLANT
TOWER SMARTMIX MINI (MISCELLANEOUS) ×1 IMPLANT

## 2022-10-25 NOTE — Interval H&P Note (Signed)
History and Physical Interval Note:  10/25/2022 10:42 AM  Glen Carrillo  has presented today for surgery, with the diagnosis of Right shoulder endstage osteoarthritis.  The various methods of treatment have been discussed with the patient and family. After consideration of risks, benefits and other options for treatment, the patient has consented to  Procedure(s) with comments: TOTAL SHOULDER ARTHROPLASTY (Right) - interscalene block  120 as a surgical intervention.  The patient's history has been reviewed, patient examined, no change in status, stable for surgery.  I have reviewed the patient's chart and labs.  Questions were answered to the patient's satisfaction.     Verlee Rossetti

## 2022-10-25 NOTE — Brief Op Note (Signed)
10/25/2022  2:58 PM  PATIENT:  Lolita Rieger  67 y.o. male  PRE-OPERATIVE DIAGNOSIS:  Right shoulder endstage osteoarthritis  POST-OPERATIVE DIAGNOSIS:  Right shoulder endstage osteoarthritis  PROCEDURE:  Procedure(s) with comments: TOTAL SHOULDER ARTHROPLASTY (Right) - interscalene block  120 [employer] Unite with APG glenoid  SURGEON:  Surgeons and Role:    Beverely Low, MD - Primary  PHYSICIAN ASSISTANT:   ASSISTANTS: Thea Gist, PA-C   ANESTHESIA:   regional and general  EBL:  150 mL   BLOOD ADMINISTERED:none  DRAINS: none   LOCAL MEDICATIONS USED:  MARCAINE     SPECIMEN:  No Specimen  DISPOSITION OF SPECIMEN:  N/A  COUNTS:  YES  TOURNIQUET:  * No tourniquets in log *  DICTATION: .Other Dictation: Dictation Number [id number]  PLAN OF CARE: Discharge to home after PACU  PATIENT DISPOSITION:  PACU - hemodynamically stable.   Delay start of Pharmacological VTE agent (>24hrs) due to surgical blood loss or risk of bleeding: not applicable

## 2022-10-25 NOTE — Discharge Instructions (Signed)
Ice to the shoulder constantly.  Keep the incision covered and clean and dry for one week, then ok to get it wet in the shower. Change to the big Band Aid on Sunday and then leave one for the remainder of the week. After one week remove the Band Aid and let the incision get air and may get it wet.   Do exercise as instructed several times per day.  DO NOT reach behind your back or push up out of a chair with the operative arm.  Use a sling while you are up and around for comfort, may remove while seated.  Keep pillow propped behind the operative elbow.  Follow up with Dr Ranell Patrick in two weeks in the office, call [phone] for appt  Please call Dr Branden Shallenberger(cell) at [phone] with any questions or concerns

## 2022-10-25 NOTE — Anesthesia Procedure Notes (Signed)
Procedure Name: Intubation Date/Time: 10/25/2022 12:47 PM  Performed by: Doran Clay, CRNAPre-anesthesia Checklist: Patient identified, Emergency Drugs available, Suction available, Patient being monitored and Timeout performed Patient Re-evaluated:Patient Re-evaluated prior to induction Oxygen Delivery Method: Circle system utilized Preoxygenation: Pre-oxygenation with 100% oxygen Induction Type: IV induction Ventilation: Mask ventilation without difficulty Laryngoscope Size: 4 and Mac Grade View: Grade I Tube type: Oral Tube size: 7.5 mm Number of attempts: 1 Airway Equipment and Method: Stylet Placement Confirmation: ETT inserted through vocal cords under direct vision, positive ETCO2 and breath sounds checked- equal and bilateral Secured at: 23 cm Tube secured with: Tape Dental Injury: Teeth and Oropharynx as per pre-operative assessment

## 2022-10-25 NOTE — Anesthesia Procedure Notes (Signed)
Anesthesia Regional Block: Interscalene brachial plexus block   Pre-Anesthetic Checklist: , timeout performed,  Correct Patient, Correct Site, Correct Laterality,  Correct Procedure, Correct Position, site marked,  Risks and benefits discussed,  Surgical consent,  Pre-op evaluation,  At surgeon's request and post-op pain management  Laterality: Upper and Right  Prep: Maximum Sterile Barrier Precautions used, chloraprep       Needles:  Injection technique: Single-shot  Needle Type: Echogenic Needle     Needle Length: 5cm  Needle Gauge: 21     Additional Needles:   Procedures:,,,, ultrasound used (permanent image in chart),,    Narrative:  Start time: 10/25/2022 11:08 AM End time: 10/25/2022 11:15 AM Injection made incrementally with aspirations every 5 mL.  Performed by: Personally  Anesthesiologist: Trevor Iha, MD  Additional Notes: Block assessed prior to procedure. Patient tolerated procedure well.

## 2022-10-25 NOTE — Transfer of Care (Signed)
Immediate Anesthesia Transfer of Care Note  Patient: SADIE ZANDER  Procedure(s) Performed: TOTAL SHOULDER ARTHROPLASTY (Right: Shoulder)  Patient Location: PACU  Anesthesia Type:General  Level of Consciousness: sedated  Airway & Oxygen Therapy: Patient Spontanous Breathing and Patient connected to face mask oxygen  Post-op Assessment: Report given to RN and Post -op Vital signs reviewed and stable  Post vital signs: Reviewed and stable  Last Vitals:  Vitals Value Taken Time  BP    Temp    Pulse 69 10/25/22 1504  Resp 20 10/25/22 1504  SpO2 99 % 10/25/22 1504  Vitals shown include unfiled device data.  Last Pain:  Vitals:   10/25/22 0957  TempSrc:   PainSc: 4          Complications: No notable events documented.

## 2022-10-25 NOTE — Op Note (Unsigned)
NAME: VERNON, ZETTLER MEDICAL RECORD NO: [id number] ACCOUNT NO: [id number] DATE OF BIRTH: 1955/04/16 FACILITY: [facility] LOCATION: [facility] PHYSICIAN: Almedia Balls. Ranell Patrick, MD  Operative Report   DATE OF PROCEDURE: 10/25/2022  PREOPERATIVE DIAGNOSIS:  Right shoulder end-stage arthritis.  POSTOPERATIVE DIAGNOSIS:  Right shoulder end-stage arthritis.  PROCEDURE PERFORMED:  Right anatomic total shoulder arthroplasty using De[employer] Unite system with APG glenoid.  ATTENDING SURGEON:  Almedia Balls. Ranell Patrick, MD  ASSISTANT:  Konrad Felix Dixon, [place], who was scrubbed during the entire procedure and necessary for satisfactory completion of surgery.  ANESTHESIA:  General anesthesia was used plus interscalene block.  ESTIMATED BLOOD LOSS:  150 mL  FLUID REPLACEMENT:  1500 mL crystalloid.  COUNTS:  Instrument counts correct.  COMPLICATIONS:  No complications.  ANTIBIOTICS:  Perioperative antibiotics were given.  INDICATIONS:  The patient is a 67 year old male with worsening right shoulder pain secondary to end-stage arthritis.  The patient has had a previous left anatomic shoulder replacement and has done well with that to deal with arthritis.  The patient has  had progressive pain in the right shoulder despite conservative management and desires anatomic total shoulder arthroplasty to eliminate pain and restore function.  Informed consent obtained.  DESCRIPTION OF PROCEDURE:  After an adequate level of anesthesia was achieved, the patient was positioned in the modified beach chair position.  Right shoulder correctly identified and sterile prep and drape performed.  Timeout called, verifying correct  patient, correct site. We entered the shoulder using a standard deltopectoral approach, starting at the coracoid process and extending down the anterior humerus.  Dissection down through subcutaneous tissues using Bovie.  Cephalic vein was identified,  taken laterally with the deltoid, pectoralis taken  medially.  Conjoined tendon was identified and retracted medially.  Deep retractors were placed.  We then used 0 Vicryl figure-of-eight suture x2 and tenodesed the biceps in situ with 0 Vicryl.  Once we  had the biceps tenodesis, we released the subscapularis subperiosteally off the lesser tuberosity and tagged for repair at the end with #2 FiberWire suture in a modified Mason-Allen suture technique.  We released the inferior capsule.  We then placed  T-handle Crego over the humeral head and underneath the biceps and the rotator cuff.  We placed a reverse Hohmann medially.  We placed the elbow at the patient's side and externally rotated 30 degrees and performed an anterior to posterior cut with the  oscillating saw at the level of the rotator cuff insertion.  We removed the head at the back table and used that for available bone graft.  We then removed excess osteophytes with a rongeur.  Next, we subluxed the humerus posteriorly, gaining good  exposure of the glenoid.  We removed the capsule and labrum and biceps stump.  We placed our deep retractors protecting the axillary nerve throughout.  We identified the center point of the glenoid for our guide pin.  We placed the guide pin for the 52  glenoid.  We then did our central reaming with the 52 reamer and then our peripheral bow tie reamer to complete the preparation for the APG glenoid.  Next, we drilled our central peg hole out.  We then did our 3 peripheral holes using that hole drilling  guide, referencing off the 12 and 6 o'clock position for the glenoid.  Once we had our holes drilled, we placed Gelfoam soaked in thrombin in the 3 peripheral holes.  We then vacuum mixed high viscosity cement on the back table and then  cemented the 3  peripheral holes for the APG glenoid and impacted the glenoid into position.  We held pressure on the glenoid until the cement was hardened on the back table.  Next, we went to the humeral side.  We reamed up to a size 14  and then broached with the 12  and then the 14 broach.  We trialled with a 14 stem in and a 14 metaphysis.  We used initially a 52-18 and then went to the 52-21 eccentric for best coverage on the cut face of the humeral head.  Once we removed the trial components, we irrigated  thoroughly.  We were happy with our soft tissue balancing and stability of the shoulder.  We then drilled holes with a 1.6 drill bit in the lesser tuberosity and placed #2 FiberWire suture for repair of the subscap x3.  We then used available bone graft  from the humeral head in impaction grafting technique with the [employer] Unite 14 stem 14 metaphysis impacted in 30 degrees of retroversion.  We had excellent support and stability for the stem.  We then selected the real 52-21 eccentric head and  then placed that dialed superiorly and posteriorly for best coverage.  We impacted that on the trunnion.  We then reduced the shoulder and again had excellent soft tissue balance and stability.  We irrigated thoroughly.  We then went ahead and  anatomically repaired the subscapularis back to lesser tuberosity with drill holes through bone and we also repaired the rotator interval.  This did not restrict range of motion.  We had excellent stability.  We then went ahead and irrigated and closed  deltopectoral interval with 0 Vicryl suture followed by 2-0 Vicryl for subcutaneous closure and 4-0 Monocryl for skin.  Steri-Strips applied followed by sterile dressing.  The patient tolerated surgery well.   VAI D: 10/25/2022 3:03:59 pm T: 10/25/2022 8:38:00 pm  JOB: [id number]/ [id number]

## 2022-10-25 NOTE — Evaluation (Signed)
Occupational Therapy Evaluation Patient Details Name: Glen Carrillo MRN: [id number] DOB: February 01, 1956 Today's Date: 10/25/2022   History of Present Illness Mr. Glen Carrillo is a 67 yr old male who is s/p a R total shoulder arthroplasty 10-25-22, due to end stage OA.   Clinical Impression   Pt is s/p R shoulder replacement on 10-25-22. Therapist provided education and instruction to patient and spouse with regards to ROM/exercises, post-op precautions, UE and sling positioning, donning upper extremity clothing, recommendations for bathing while maintaining shoulder precautions, use of ice for pain and edema management and correctly donning/doffing sling. Patient and spouse verbalized understanding and demonstrated as needed. Patient needed assistance to donn shirt, underwear, pants, and shoes with instruction provided on compensatory strategies to perform ADLs. Patient to follow up with MD for further therapy needs.         If plan is discharge home, recommend the following: Assist for transportation;Assistance with cooking/housework;A little help with bathing/dressing/bathroom;Help with stairs or ramp for entrance    Functional Status Assessment  Patient has had a recent decline in their functional status and demonstrates the ability to make significant improvements in function in a reasonable and predictable amount of time.  Equipment Recommendations  None recommended by OT    Recommendations for Other Services       Precautions / Restrictions Precautions Precautions: Shoulder Type of Shoulder Precautions: sling at all times except ADLs/exercise, no shoulder ROM, okay to perform elbow, wrist and hand ROM Shoulder Interventions: Shoulder sling/immobilizer Precaution Booklet Issued: Yes (comment) Required Braces or Orthoses: Sling Restrictions Weight Bearing Restrictions: Yes RUE Weight Bearing: Non weight bearing      Mobility Bed Mobility        General bed mobility comments: pt was  received seated in chair    Transfers Overall transfer level: Needs assistance   Transfers: Sit to/from Stand Sit to Stand: Supervision                  Balance Overall balance assessment: No apparent balance deficits (not formally assessed)         ADL either performed or assessed with clinical judgement      Pertinent Vitals/Pain Pain Assessment Pain Assessment: No/denies pain        Communication Communication Communication: No apparent difficulties   Cognition Arousal: Alert Behavior During Therapy: WFL for tasks assessed/performed Overall Cognitive Status: Within Functional Limits for tasks assessed              Shoulder Instructions Shoulder Instructions Donning/doffing shirt without moving shoulder: Caregiver independent with task Method for sponge bathing under operated UE: Caregiver independent with task Donning/doffing sling/immobilizer: Minimal assistance (with spouse/caregiver performing) Correct positioning of sling/immobilizer: Minimal assistance (with spouse/caregiver performing) Pendulum exercises (written home exercise program):  (N/A) ROM for elbow, wrist and digits of operated UE: Caregiver independent with task;Patient able to independently direct caregiver Sling wearing schedule (on at all times/off for ADL's): Caregiver independent with task;Patient able to independently direct caregiver Proper positioning of operated UE when showering: Caregiver independent with task;Patient able to independently direct caregiver Dressing change:  (N/A) Positioning of UE while sleeping: Caregiver independent with task;Patient able to independently direct caregiver    Home Living Family/patient expects to be discharged to:: Private residence Living Arrangements: Spouse/significant other Available Help at Discharge: Family Type of Home: House Home Access: Stairs to enter Secretary/administrator of Steps: 2   Home Layout: Multi-level;Able to live on  main level with bedroom/bathroom Alternate Level Stairs-Number of Steps: 3 story  home. Bedroom and full bathroom on main level   Bathroom Shower/Tub: Walk-in shower         Home Equipment: Pharmacist, hospital (2 wheels);Cane - single point          Prior Functioning/Environment Prior Level of Function : Independent/Modified Independent;Driving             Mobility Comments: He was independent with ambulation. ADLs Comments: He was independent with ADLs, driving, and sharing household chores with his spouse.        OT Problem List: Impaired UE functional use                   AM-PAC OT "6 Clicks" Daily Activity     Outcome Measure Help from another person eating meals?: None Help from another person taking care of personal grooming?: None Help from another person toileting, which includes using toliet, bedpan, or urinal?: A Little Help from another person bathing (including washing, rinsing, drying)?: A Little Help from another person to put on and taking off regular upper body clothing?: A Little Help from another person to put on and taking off regular lower body clothing?: A Little 6 Click Score: 20   End of Session Equipment Utilized During Treatment: Other (comment) (N/A) Nurse Communication: Other (comment) (shoulder education completed)  Activity Tolerance: Patient tolerated treatment well Patient left: in chair;with family/visitor present  OT Visit Diagnosis: Muscle weakness (generalized) (M62.81)                Time: 1610-9604 OT Time Calculation (min): 20 min Charges:  OT General Charges $OT Visit: 1 Visit OT Evaluation $OT Eval Low Complexity: 1 Low    Kerrington Greenhalgh L Jamya Starry, OTR/L 10/25/2022, 5:18 PM

## 2022-10-26 NOTE — Anesthesia Postprocedure Evaluation (Signed)
Anesthesia Post Note  Patient: Glen Carrillo  Procedure(s) Performed: TOTAL SHOULDER ARTHROPLASTY (Right: Shoulder)     Patient location during evaluation: PACU Anesthesia Type: Regional Level of consciousness: awake and alert Pain management: pain level controlled Vital Signs Assessment: post-procedure vital signs reviewed and stable Respiratory status: spontaneous breathing, nonlabored ventilation, respiratory function stable and patient connected to nasal cannula oxygen Cardiovascular status: blood pressure returned to baseline and stable Postop Assessment: no apparent nausea or vomiting Anesthetic complications: no   No notable events documented.  Last Vitals:  Vitals:   10/25/22 1530 10/25/22 1545  BP: (!) 153/81 (!) 156/83  Pulse: 76 73  Resp: (!) 21 (!) 24  Temp:  36.5 C  SpO2: 97% 97%    Last Pain:  Vitals:   10/25/22 1545  TempSrc:   PainSc: 2                  [place]

## 2022-10-29 ENCOUNTER — Encounter (HOSPITAL_COMMUNITY): Payer: Self-pay | Admitting: Orthopedic Surgery

## 2023-01-16 ENCOUNTER — Other Ambulatory Visit: Payer: Self-pay | Admitting: Family Medicine

## 2023-01-16 DIAGNOSIS — R918 Other nonspecific abnormal finding of lung field: Secondary | ICD-10-CM

## 2023-03-10 ENCOUNTER — Other Ambulatory Visit: Payer: Medicare Other

## 2023-05-05 ENCOUNTER — Other Ambulatory Visit (HOSPITAL_BASED_OUTPATIENT_CLINIC_OR_DEPARTMENT_OTHER): Payer: Self-pay | Admitting: Family

## 2023-05-15 ENCOUNTER — Other Ambulatory Visit: Payer: Self-pay | Admitting: Student

## 2023-05-15 DIAGNOSIS — R109 Unspecified abdominal pain: Secondary | ICD-10-CM

## 2023-05-23 ENCOUNTER — Ambulatory Visit
Admission: RE | Admit: 2023-05-23 | Discharge: 2023-05-23 | Disposition: A | Discharge status: Home / Self Care | Source: Ambulatory Visit | Attending: Student | Admitting: Student

## 2023-05-23 DIAGNOSIS — R109 Unspecified abdominal pain: Secondary | ICD-10-CM

## 2023-05-23 MED ORDER — IOPAMIDOL (ISOVUE-300) INJECTION 61%
100.0000 mL | Freq: Once | INTRAVENOUS | Status: AC | PRN
  Administered 2023-05-23: 100 mL via INTRAVENOUS

## 2023-06-03 ENCOUNTER — Ambulatory Visit (HOSPITAL_COMMUNITY): Admitting: Anesthesiology

## 2023-06-03 ENCOUNTER — Ambulatory Visit (HOSPITAL_COMMUNITY)
Admission: RE | Admit: 2023-06-03 | Discharge: 2023-06-03 | Disposition: A | Discharge status: Home / Self Care | Attending: Gastroenterology | Admitting: Gastroenterology

## 2023-06-03 ENCOUNTER — Other Ambulatory Visit: Payer: Self-pay

## 2023-06-03 ENCOUNTER — Encounter (HOSPITAL_COMMUNITY)
Admission: RE | Disposition: A | Discharge status: Home / Self Care | Payer: Self-pay | Source: Home / Self Care | Attending: Gastroenterology

## 2023-06-03 ENCOUNTER — Ambulatory Visit (HOSPITAL_BASED_OUTPATIENT_CLINIC_OR_DEPARTMENT_OTHER): Admitting: Anesthesiology

## 2023-06-03 DIAGNOSIS — I10 Essential (primary) hypertension: Secondary | ICD-10-CM | POA: Insufficient documentation

## 2023-06-03 DIAGNOSIS — K649 Unspecified hemorrhoids: Secondary | ICD-10-CM

## 2023-06-03 DIAGNOSIS — K219 Gastro-esophageal reflux disease without esophagitis: Secondary | ICD-10-CM | POA: Diagnosis not present

## 2023-06-03 DIAGNOSIS — I441 Atrioventricular block, second degree: Secondary | ICD-10-CM | POA: Diagnosis not present

## 2023-06-03 DIAGNOSIS — Z87891 Personal history of nicotine dependence: Secondary | ICD-10-CM | POA: Insufficient documentation

## 2023-06-03 DIAGNOSIS — R197 Diarrhea, unspecified: Secondary | ICD-10-CM | POA: Insufficient documentation

## 2023-06-03 DIAGNOSIS — K5730 Diverticulosis of large intestine without perforation or abscess without bleeding: Secondary | ICD-10-CM | POA: Insufficient documentation

## 2023-06-03 DIAGNOSIS — R1084 Generalized abdominal pain: Secondary | ICD-10-CM | POA: Diagnosis not present

## 2023-06-03 DIAGNOSIS — K6389 Other specified diseases of intestine: Secondary | ICD-10-CM | POA: Insufficient documentation

## 2023-06-03 DIAGNOSIS — K640 First degree hemorrhoids: Secondary | ICD-10-CM | POA: Diagnosis not present

## 2023-06-03 DIAGNOSIS — R933 Abnormal findings on diagnostic imaging of other parts of digestive tract: Secondary | ICD-10-CM | POA: Diagnosis not present

## 2023-06-03 DIAGNOSIS — R195 Other fecal abnormalities: Secondary | ICD-10-CM | POA: Diagnosis not present

## 2023-06-03 DIAGNOSIS — K52832 Lymphocytic colitis: Secondary | ICD-10-CM | POA: Insufficient documentation

## 2023-06-03 HISTORY — PX: COLONOSCOPY: SHX5424

## 2023-06-03 SURGERY — COLONOSCOPY
Anesthesia: Monitor Anesthesia Care

## 2023-06-03 MED ORDER — SODIUM CHLORIDE 0.9 % IV SOLN: INTRAVENOUS | Status: DC

## 2023-06-03 MED ORDER — ONDANSETRON HCL 4 MG/2ML IJ SOLN
INTRAMUSCULAR | Status: DC | PRN
  Administered 2023-06-03: 4 mg via INTRAVENOUS

## 2023-06-03 MED ORDER — PROPOFOL 500 MG/50ML IV EMUL
INTRAVENOUS | Status: DC | PRN
  Administered 2023-06-03: 80 ug/kg/min via INTRAVENOUS

## 2023-06-03 MED ORDER — PROPOFOL 10 MG/ML IV BOLUS
INTRAVENOUS | Status: DC | PRN
  Administered 2023-06-03: 20 mg via INTRAVENOUS
  Administered 2023-06-03: 30 mg via INTRAVENOUS
  Administered 2023-06-03: 40 mg via INTRAVENOUS
  Administered 2023-06-03 (×2): 20 mg via INTRAVENOUS
  Administered 2023-06-03: 40 mg via INTRAVENOUS
  Administered 2023-06-03 (×2): 20 mg via INTRAVENOUS

## 2023-06-03 NOTE — Anesthesia Preprocedure Evaluation (Addendum)
 Anesthesia Evaluation  Patient identified by MRN, date of birth, ID band Patient awake    Reviewed: Allergy & Precautions, NPO status , Patient's Chart, lab work & pertinent test results  Airway Mallampati: I  TM Distance: >3 FB Neck ROM: Full    Dental  (+) Teeth Intact, Dental Advisory Given   Pulmonary sleep apnea , former smoker   breath sounds clear to auscultation       Cardiovascular hypertension, Pt. on medications + dysrhythmias  Rhythm:Regular Rate:Bradycardia     Neuro/Psych negative neurological ROS  negative psych ROS   GI/Hepatic Neg liver ROS,GERD  Medicated,,  Endo/Other  negative endocrine ROS    Renal/GU negative Renal ROS     Musculoskeletal  (+) Arthritis ,    Abdominal   Peds  Hematology negative hematology ROS (+)   Anesthesia Other Findings   Reproductive/Obstetrics                             Anesthesia Physical Anesthesia Plan  ASA: 2  Anesthesia Plan: MAC   Post-op Pain Management: Minimal or no pain anticipated   Induction: Intravenous  PONV Risk Score and Plan: 1 and Propofol infusion  Airway Management Planned: Natural Airway and Nasal Cannula  Additional Equipment: None  Intra-op Plan:   Post-operative Plan:   Informed Consent: I have reviewed the patients History and Physical, chart, labs and discussed the procedure including the risks, benefits and alternatives for the proposed anesthesia with the patient or authorized representative who has indicated his/her understanding and acceptance.       Plan Discussed with: CRNA  Anesthesia Plan Comments:         Anesthesia Quick Evaluation

## 2023-06-03 NOTE — Interval H&P Note (Signed)
 History and Physical Interval Note:  06/03/2023 1:44 PM  Glen Carrillo  has presented today for surgery, with the diagnosis of colonsocopy.  The various methods of treatment have been discussed with the patient and family. After consideration of risks, benefits and other options for treatment, the patient has consented to  Procedure(s): COLONOSCOPY (N/A) as a surgical intervention.  The patient's history has been reviewed, patient examined, no change in status, stable for surgery.  I have reviewed the patient's chart and labs.  Questions were answered to the patient's satisfaction.     Shirley Friar

## 2023-06-03 NOTE — H&P (Signed)
 Date of Initial H&P: 05/14/23  History reviewed, patient examined, no change in status, stable for surgery.

## 2023-06-03 NOTE — Op Note (Signed)
 [facility] Patient Name: Glen Carrillo Procedure Date : 06/03/2023 MRN: [id number] Attending MD: Shirley Friar , MD, [id number] Date of Birth: February 27, 1956 CSN: [id number] Age: 68 Admit Type: Outpatient Procedure:                Colonoscopy Indications:              Last colonoscopy: November 2020, Generalized                            abdominal pain, Diarrhea, Abnormal CT of the GI                            tract Providers:                Shirley Friar, MD, Martha Clan, RN,                            Rhodia Albright, Technician Referring MD:             Genia Hotter Medicines:                Propofol per Anesthesia, Monitored Anesthesia Care Complications:            No immediate complications. Estimated Blood Loss:     Estimated blood loss was minimal. Procedure:                Pre-Anesthesia Assessment:                           - Prior to the procedure, a History and Physical                            was performed, and patient medications and                            allergies were reviewed. The patient's tolerance of                            previous anesthesia was also reviewed. The risks                            and benefits of the procedure and the sedation                            options and risks were discussed with the patient.                            All questions were answered, and informed consent                            was obtained. Prior Anticoagulants: The patient has                            taken no anticoagulant or antiplatelet agents. ASA  Grade Assessment: III - A patient with severe                            systemic disease. After reviewing the risks and                            benefits, the patient was deemed in satisfactory                            condition to undergo the procedure.                           After obtaining informed consent, the colonoscope                             was passed under direct vision. Throughout the                            procedure, the patient's blood pressure, pulse, and                            oxygen saturations were monitored continuously. The                            PCF-HQ190L ([id number]) Olympus colonoscope was                            introduced through the anus and advanced to the the                            cecum, identified by appendiceal orifice and                            ileocecal valve. The colonoscopy was performed                            without difficulty. The patient tolerated the                            procedure fairly well. The quality of the bowel                            preparation was adequate and good. The terminal                            ileum, ileocecal valve, appendiceal orifice, and                            rectum were photographed. Scope In: 2:10:06 PM Scope Out: 2:31:44 PM Scope Withdrawal Time: 0 hours 16 minutes 59 seconds  Total Procedure Duration: 0 hours 21 minutes 38 seconds  Findings:      The perianal and digital rectal examinations were normal.      A patchy area of mildly congested and erythematous mucosa was found in  the sigmoid colon. Biopsies were taken with a cold forceps for       histology. Estimated blood loss was minimal.      There is no endoscopic evidence of inflammation or colitis in the       rectum, in the descending colon, in the splenic flexure, in the       transverse colon, in the hepatic flexure, in the ascending colon and in       the cecum.      Normal mucosa was found from descending colon to cecum. Biopsies for       histology were taken with a cold forceps from the entire colon for       evaluation of microscopic colitis. Estimated blood loss was minimal.      Multiple medium-mouthed and small-mouthed diverticula were found in the       sigmoid colon and descending colon.      Internal hemorrhoids were found during retroflexion.  The hemorrhoids       were medium-sized and Grade I (internal hemorrhoids that do not       prolapse).      The terminal ileum appeared normal. Biopsies were taken with a cold       forceps for histology. Estimated blood loss was minimal. Impression:               - Congested and erythematous mucosa in the sigmoid                            colon. Biopsied.                           - Normal mucosa from descending to cecum. Biopsied.                           - Diverticulosis in the sigmoid colon and in the                            descending colon.                           - Internal hemorrhoids.                           - The examined portion of the ileum was normal.                            Biopsied. Recommendation:           - Patient has a contact number available for                            emergencies. The signs and symptoms of potential                            delayed complications were discussed with the                            patient. Return to normal activities tomorrow.  Written discharge instructions were provided to the                            patient.                           - High fiber diet.                           - Await pathology results.                           - Repeat colonoscopy for surveillance based on                            pathology results. Procedure Code(s):        --- Professional ---                           [phone], Colonoscopy, flexible; with biopsy, single                            or multiple Diagnosis Code(s):        --- Professional ---                           R19.7, Diarrhea, unspecified                           R10.84, Generalized abdominal pain                           K63.89, Other specified diseases of intestine                           K64.0, First degree hemorrhoids                           K57.30, Diverticulosis of large intestine without                            perforation or  abscess without bleeding                           R93.3, Abnormal findings on diagnostic imaging of                            other parts of digestive tract CPT copyright 2022 American [employer] Association. All rights reserved. The codes documented in this report are preliminary and upon coder review may  be revised to meet current compliance requirements. Shirley Friar, MD 06/03/2023 2:44:42 PM This report has been signed electronically. Number of Addenda: 0

## 2023-06-03 NOTE — Discharge Instructions (Signed)

## 2023-06-03 NOTE — Transfer of Care (Signed)
 Immediate Anesthesia Transfer of Care Note  Patient: Glen Carrillo  Procedure(s) Performed: COLONOSCOPY  Patient Location: PACU  Anesthesia Type:MAC  Level of Consciousness: sleeping, sedated  Airway & Oxygen Therapy: Patient connected to nasal cannula oxygen  Post-op Assessment: Report given to RN and Post -op Vital signs reviewed and stable  Post vital signs: Reviewed and stable  Last Vitals:  Vitals Value Taken Time  BP 121/59 14:39  06/03/23  Temp    Pulse 78 14:39   06/03/23  Resp 12 14:39   06/03/23  SpO2 100 14:39    06/03/23    Last Pain:  Vitals:   06/03/23 1323  TempSrc: Temporal  PainSc: 0-No pain         Complications: No notable events documented.

## 2023-06-04 LAB — SURGICAL PATHOLOGY

## 2023-06-04 NOTE — Anesthesia Postprocedure Evaluation (Signed)
 Anesthesia Post Note  Patient: Glen Carrillo  Procedure(s) Performed: COLONOSCOPY     Patient location during evaluation: PACU Anesthesia Type: MAC Level of consciousness: awake and alert Pain management: pain level controlled Vital Signs Assessment: post-procedure vital signs reviewed and stable Respiratory status: spontaneous breathing, nonlabored ventilation, respiratory function stable and patient connected to nasal cannula oxygen Cardiovascular status: stable and blood pressure returned to baseline Postop Assessment: no apparent nausea or vomiting Anesthetic complications: no   No notable events documented.  Last Vitals:  Vitals:   06/03/23 1450 06/03/23 1500  BP: (!) 162/68 (!) 174/78  Pulse: (!) 57 (!) 55  Resp: 18 19  Temp:    SpO2: 98% 98%    Last Pain:  Vitals:   06/03/23 1500  TempSrc:   PainSc: 2                  Shelton Silvas

## 2023-06-05 ENCOUNTER — Encounter (HOSPITAL_COMMUNITY): Payer: Self-pay | Admitting: Gastroenterology

## 2023-06-05 NOTE — Progress Notes (Unsigned)
 Cardiology Office Note    Patient Name: Glen Carrillo Date of Encounter: 06/05/2023  Primary Care Provider:  Sheliah Hatch, PA-C Primary Cardiologist:  Chilton Si, MD Primary Electrophysiologist: None   Past Medical History    Past Medical History:  Diagnosis Date   Arthritis    Essential hypertension 12/25/2020   GERD (gastroesophageal reflux disease)    Hypertension    Pre-diabetes    Pure hypercholesterolemia 12/25/2020   Second degree atrioventricular block, Mobitz (type) I 12/25/2020    History of Present Illness  Glen Carrillo is a 68 y.o. male with a PMH of HTN, OSA, second-degree AVB type I, former tobacco abuse, HLD, GERD, prediabetes who presents today for follow-up.  Mr. Merkey was seen initially by Dr. Duke Salvia in 12/25/2020 for preoperative clearance visit.  He was also noted to have abnormal EKG with sinus bradycardia and second-degree heart block type I noted.  He had noted occasional lightheadedness and dizziness while driving but declined cardiac referral.  During visit his blood pressures were controlled and cardiac monitoring was not recommended due to sporadic episodes of bradycardia.  He was advised undergo carotid ultrasound to rule out carotid disease and syncope elicited by turning his head.  Carotid ultrasound results showed minimal blockage in the carotids.  He also underwent a sleep study that revealed sleep apnea and CPAP titration was recommended however patient was intolerant due to sensation of choking.  He was seen for follow-up by Gillian Shields, NP on 04/22/2022 and was interested in inspire device and patient was referred to Dr. Jenne Pane with ENT for further evaluation.  He underwent a hernia repair at [facility] on 09/2022 and recently completed a colonoscopy for diagnosis of diverticulosis on 06/03/2023.    Patient denies chest pain, palpitations, dyspnea, PND, orthopnea, nausea, vomiting, dizziness, syncope, edema, weight gain, or early  satiety.   Discussed the use of AI scribe software for clinical note transcription with the patient, who gave verbal consent to proceed.  History of Present Illness    ***Notes: -Last ischemic evaluation:  Review of Systems  Please see the history of present illness.    All other systems reviewed and are otherwise negative except as noted above.  Physical Exam    Wt Readings from Last 3 Encounters:  06/03/23 185 lb (83.9 kg)  10/25/22 194 lb 0.1 oz (88 kg)  10/14/22 195 lb (88.5 kg)   ZO:XWRUE were no vitals filed for this visit.,There is no height or weight on file to calculate BMI. GEN: Well nourished, well developed in no acute distress Neck: No JVD; No carotid bruits Pulmonary: Clear to auscultation without rales, wheezing or rhonchi  Cardiovascular: Normal rate. Regular rhythm. Normal S1. Normal S2.   Murmurs: There is no murmur.  ABDOMEN: Soft, non-tender, non-distended EXTREMITIES:  No edema; No deformity   EKG/LABS/ Recent Cardiac Studies   ECG personally reviewed by me today - ***  Risk Assessment/Calculations:   {Does this patient have ATRIAL FIBRILLATION?:[id number]}      Lab Results  Component Value Date   WBC 9.3 10/14/2022   HGB 12.5 (L) 10/14/2022   HCT 37.6 (L) 10/14/2022   MCV 99.2 10/14/2022   PLT 199 10/14/2022   Lab Results  Component Value Date   CREATININE 1.18 10/14/2022   BUN 27 (H) 10/14/2022   NA 138 10/14/2022   K 4.2 10/14/2022   CL 102 10/14/2022   CO2 25 10/14/2022   No results found for: "CHOL", "HDL", "LDLCALC", "LDLDIRECT", "TRIG", "CHOLHDL"  Lab Results  Component Value Date   HGBA1C 5.7 (H) 02/09/2021   Assessment & Plan    1.  Essential hypertension  2.  Second-degree AV block type I  3.  History of OSA  4.  Essential hypertension  5.  Coronary calcifications      Disposition: Follow-up with Chilton Si, MD or APP in *** months {Are you ordering a CV Procedure (e.g. stress test, cath, DCCV, TEE, etc)?    Press F2        :[id number]}   Signed, Napoleon Form, Leodis Rains, NP 06/05/2023, 12:30 PM [facility]

## 2023-06-06 ENCOUNTER — Ambulatory Visit: Attending: Nurse Practitioner | Admitting: Nurse Practitioner

## 2023-06-06 ENCOUNTER — Other Ambulatory Visit (INDEPENDENT_AMBULATORY_CARE_PROVIDER_SITE_OTHER)

## 2023-06-06 ENCOUNTER — Encounter: Payer: Self-pay | Admitting: Nurse Practitioner

## 2023-06-06 VITALS — BP 134/72 | HR 62 | Ht 72.0 in | Wt 187.0 lb

## 2023-06-06 DIAGNOSIS — G4733 Obstructive sleep apnea (adult) (pediatric): Secondary | ICD-10-CM | POA: Diagnosis present

## 2023-06-06 DIAGNOSIS — E785 Hyperlipidemia, unspecified: Secondary | ICD-10-CM

## 2023-06-06 DIAGNOSIS — I2510 Atherosclerotic heart disease of native coronary artery without angina pectoris: Secondary | ICD-10-CM | POA: Diagnosis present

## 2023-06-06 DIAGNOSIS — I10 Essential (primary) hypertension: Secondary | ICD-10-CM

## 2023-06-06 DIAGNOSIS — I441 Atrioventricular block, second degree: Secondary | ICD-10-CM

## 2023-06-06 NOTE — Progress Notes (Unsigned)
 Enrolled patient for a 14 day Zio AT monitor to be mailed to patients home  [place] to read

## 2023-06-06 NOTE — Patient Instructions (Signed)
 Medication Instructions:  Your physician recommends that you continue on your current medications as directed. Please refer to the Current Medication list given to you today. *If you need a refill on your cardiac medications before your next appointment, please call your pharmacy*  Lab Work: None ordered If you have labs (blood work) drawn today and your tests are completely normal, you will receive your results only by: MyChart Message (if you have MyChart) OR A paper copy in the mail If you have any lab test that is abnormal or we need to change your treatment, we will call you to review the results.  Testing/Procedures: ZIO AT Long term monitor-Live Telemetry  Your physician has requested you wear a ZIO patch monitor for 14 days.  This is a single patch monitor. [employer] supplies one patch monitor per enrollment. Additional  stickers are not available.  Please do not apply patch if you will be having a Nuclear Stress Test, Echocardiogram, Cardiac CT, MRI,  or Chest Xray during the period you would be wearing the monitor. The patch cannot be worn during  these tests. You cannot remove and re-apply the ZIO AT patch monitor.  Your ZIO patch monitor will be mailed 3 day USPS to your address on file. It may take 3-5 days to  receive your monitor after you have been enrolled.  Once you have received your monitor, please review the enclosed instructions. Your monitor has  already been registered assigning a specific monitor serial # to you.   Billing and Patient Assistance Program information  Meredeth Ide has been supplied with any insurance information on record for billing. [employer] offers a sliding scale Patient Assistance Program for patients without insurance, or whose  insurance does not completely cover the cost of the ZIO patch monitor. You must apply for the  Patient Assistance Program to qualify for the discounted rate. To apply, call [employer] at [phone],  select option 4, select  option 2 , ask to apply for the Patient Assistance Program, (you can request an  interpreter if needed). [employer] will ask your household income and how many people are in your  household. [employer] will quote your out-of-pocket cost based on this information. They will also be able  to set up a 12 month interest free payment plan if needed.  Applying the monitor   Shave hair from upper left chest.  Hold the abrader disc by orange tab. Rub the abrader in 40 strokes over left upper chest as indicated in  your monitor instructions.  Clean area with 4 enclosed alcohol pads. Use all pads to ensure the area is cleaned thoroughly. Let  dry.  Apply patch as indicated in monitor instructions. Patch will be placed under collarbone on left side of  chest with arrow pointing upward.  Rub patch adhesive wings for 2 minutes. Remove the white label marked "1". Remove the white label  marked "2". Rub patch adhesive wings for 2 additional minutes.  While looking in a mirror, press and release button in center of patch. A small green light will flash 3-4  times. This will be your only indicator that the monitor has been turned on.  Do not shower for the first 24 hours. You may shower after the first 24 hours.  Press the button if you feel a symptom. You will hear a small click. Record Date, Time and Symptom in  the Patient Log.   Starting the Gateway  In your kit there is a Audiological scientist box the size  of a cellphone. This is Buyer, retail. It transmits all your  recorded data to [facility]. This box must always stay within 10 feet of you. Open the box and push the *  button. There will be a light that blinks orange and then green a few times. When the light stops  blinking, the Gateway is connected to the ZIO patch. Call [employer] at [phone] to confirm your monitor is transmitting.  Returning your monitor  Remove your patch and place it inside the Gateway. In the lower half of the Gateway there is a white   bag with prepaid postage on it. Place Gateway in bag and seal. Mail package back to [place] as soon as  possible. Your physician should have your final report approximately 7 days after you have mailed back  your monitor. Call [facility] Customer Care at [phone] if you have questions regarding your ZIO AT  patch monitor. Call them immediately if you see an orange light blinking on your monitor.  If your monitor falls off in less than 4 days, contact our Monitor department at [phone]. If your  monitor becomes loose or falls off after 4 days call [employer] at [phone] for suggestions on  securing your monitor   Follow-Up: At [facility], you and your health needs are our priority.  As part of our continuing mission to provide you with exceptional heart care, our providers are all part of one team.  This team includes your primary Cardiologist (physician) and Advanced Practice Providers or APPs (Physician Assistants and Nurse Practitioners) who all work together to provide you with the care you need, when you need it.  Your next appointment:   3 month(s)  Provider:   Gillian Shields, NP    We recommend signing up for the patient portal called "MyChart".  Sign up information is provided on this After Visit Summary.  MyChart is used to connect with patients for Virtual Visits (Telemedicine).  Patients are able to view lab/test results, encounter notes, upcoming appointments, etc.  Non-urgent messages can be sent to your provider as well.   To learn more about what you can do with MyChart, go to [email or web address].   Other Instructions       1st Floor: - Lobby - Registration  - Pharmacy  - Lab - Cafe  2nd Floor: - PV Lab - Diagnostic Testing (echo, CT, nuclear med)  3rd Floor: - Vacant  4th Floor: - TCTS (cardiothoracic surgery) - AFib Clinic - Structural Heart Clinic - Vascular Surgery  - Vascular Ultrasound  5th Floor: - HeartCare  Cardiology (general and EP) - Clinical Pharmacy for coumadin, hypertension, lipid, weight-loss medications, and med management appointments    Valet parking services will be available as well.

## 2023-06-13 ENCOUNTER — Telehealth: Payer: Self-pay | Admitting: Nurse Practitioner

## 2023-06-13 NOTE — Telephone Encounter (Signed)
 Pt rec'vd Zio in the mail and wants to come by to have placed, requesting cb

## 2023-06-16 NOTE — Telephone Encounter (Signed)
 Patient had applied and started his 7 day ZIO AT monitor Friday, 06/13/23.  Reviewed instructions on how to return device to [employer] next Friday for processing.

## 2023-06-20 ENCOUNTER — Telehealth: Payer: Self-pay | Admitting: Nurse Practitioner

## 2023-06-20 NOTE — Telephone Encounter (Signed)
 Nurse is handling and will seek advice from DOD.

## 2023-06-20 NOTE — Telephone Encounter (Signed)
 Nurse brought report to me and states she will put in the provider Robin Searing, NP) box for review.  Forwarding note to Alden Server to make him aware.

## 2023-06-20 NOTE — Telephone Encounter (Signed)
 Another report comes through for [facility] at 3:18 pm HR as low as 37.  Patient still asymptomatic. Discussed again  w/ DOD and Robin Searing, NP.  Will place monitor reports in [place] box. Will forward call to him and Dr. Duke Salvia. Called pt again and advised that if he develops any symptom (such as dizziness, light headedness) /starts not feeling well to go straight to ED. Pt agreeable to plan. He is going to wear the monitor another day as well.  Recent BP/HRs: 144/83  43 122/73  50 142/63  42

## 2023-06-20 NOTE — Telephone Encounter (Signed)
 [employer] is calling to report abnormal Zio results.  Call transferred to triage.

## 2023-06-20 NOTE — Telephone Encounter (Signed)
 Confirmed with pt, states he is & was feeling fine.  Was sitting at his desk working when occurred.  Aware forwarding to provider for review.  Patient agreeable.

## 2023-06-20 NOTE — Telephone Encounter (Signed)
 Patient had auto trigger for complete heart block rate of 36 lasted 7.7  Elicia states she called patient and he is asymptomatic and feeling fine

## 2023-08-12 ENCOUNTER — Ambulatory Visit: Payer: Self-pay | Admitting: Nurse Practitioner

## 2023-08-12 ENCOUNTER — Telehealth: Payer: Self-pay | Admitting: Nurse Practitioner

## 2023-08-12 DIAGNOSIS — I441 Atrioventricular block, second degree: Secondary | ICD-10-CM | POA: Diagnosis not present

## 2023-08-12 DIAGNOSIS — I10 Essential (primary) hypertension: Secondary | ICD-10-CM | POA: Diagnosis not present

## 2023-08-12 DIAGNOSIS — R9431 Abnormal electrocardiogram [ECG] [EKG]: Secondary | ICD-10-CM

## 2023-08-12 NOTE — Telephone Encounter (Signed)
 error

## 2023-08-20 ENCOUNTER — Encounter: Payer: Self-pay | Admitting: Cardiovascular Disease

## 2023-08-20 ENCOUNTER — Ambulatory Visit: Attending: Cardiovascular Disease | Admitting: Cardiovascular Disease

## 2023-08-20 VITALS — BP 100/70 | HR 45 | Ht 72.0 in | Wt 193.6 lb

## 2023-08-20 DIAGNOSIS — I459 Conduction disorder, unspecified: Secondary | ICD-10-CM | POA: Insufficient documentation

## 2023-08-20 DIAGNOSIS — I10 Essential (primary) hypertension: Secondary | ICD-10-CM | POA: Insufficient documentation

## 2023-08-20 NOTE — Patient Instructions (Addendum)
 Medication Instructions:  Your physician recommends that you continue on your current medications as directed. Please refer to the Current Medication list given to you today. *If you need a refill on your cardiac medications before your next appointment, please call your pharmacy*   Testing/Procedures: Treadmill Stress Test Your physician has requested that you have an exercise tolerance test. For further information please visit [email or web address] Please also follow instruction sheet, as given.   Echocardiogram  Your physician has requested that you have an echocardiogram. Echocardiography is a painless test that uses sound waves to create images of your heart. It provides your doctor with information about the size and shape of your heart and how well your heart's chambers and valves are working. This procedure takes approximately one hour. There are no restrictions for this procedure. Please do NOT wear cologne, perfume, aftershave, or lotions (deodorant is allowed). Please arrive 15 minutes prior to your appointment time.  Please note: We ask at that you not bring children with you during ultrasound (echo/ vascular) testing. Due to room size and safety concerns, children are not allowed in the ultrasound rooms during exams. Our front office staff cannot provide observation of children in our lobby area while testing is being conducted. An adult accompanying a patient to their appointment will only be allowed in the ultrasound room at the discretion of the ultrasound technician under special circumstances. We apologize for any inconvenience.  Please arrive 15 minutes prior to your appointment time for registration and insurance purposes.  The test will take approximately 45 minutes to complete.  How to prepare for your Exercise Stress Test: Do bring a list of your current medications with you.  If not listed below, you may take your medications as normal. Do wear comfortable clothes (no  dresses or overalls) and walking shoes, tennis shoes preferred (no heels or open toed shoes are allowed) Do Not wear cologne, perfume, aftershave or lotions (deodorant is allowed). Please report to [address] [facility], [address] for your test.  If these instructions are not followed, your test will have to be rescheduled.  If you have questions or concerns about your appointment, you can call the Stress Lab at [phone].  If you cannot keep your appointment, please provide 24 hours notification to the Stress Lab, to avoid a possible $50 charge to your account.  Follow-Up: At [facility], you and your health needs are our priority.  As part of our continuing mission to provide you with exceptional heart care, our providers are all part of one team.  This team includes your primary Cardiologist (physician) and Advanced Practice Providers or APPs (Physician Assistants and Nurse Practitioners) who all work together to provide you with the care you need, when you need it.  Your next appointment:   4 - 6 week(s)  Provider:   Marlane Silver, MD

## 2023-08-20 NOTE — Progress Notes (Addendum)
 Electrophysiology Office Note:    Date:  08/20/2023   ID:  Glen Carrillo, DOB [birth year], MRN [id number]  PCP:  Lanis Thresa JAYSON DEVONNA   [facility] Providers Cardiologist:  Annabella Scarce, MD     Referring MD: Wyn Jackee VEAR Mickey., NP   History of Present Illness:    Glen Carrillo is a 68 y.o. male with a medical history significant for obstructive sleep apnea, bilateral knee replacement, referred for evaluation and management of bradycardia due to AV block.     He was seen for preoperative evaluation and October 2022 noted to have second-degree type I heart block on EKG at that time.  He was asymptomatic though, so no further intervention was pursued.  He has noted light headedness with turning his head and had a carotid ultrasound that showed minimal occlusive disease.  He has been diagnosed with sleep apnea but did not tolerate CPAP.  He was referred for the inspire device but has not pursued it due to cost concerns.  Another monitor was placed that had showed episodes of complete heart block with junctional escape as well as brief episodes of second-degree type I AV block.  There are several phone notes documenting call the patient after events stating that he was asymptomatic.  Discussed the use of AI scribe software for clinical note transcription with the patient, who gave verbal consent to proceed.  History of Present Illness Glen Carrillo is a 68 year old male with second degree type one heart block who presents with concerns about abnormal heart rhythms.  In October 2022, during a preoperative evaluation, a second degree type one heart block was identified on EKG. At that time, he was asymptomatic, and no further intervention was pursued. He has experienced symptoms of lightheadedness when turning his head. A carotid ultrasound showed minimal occlusive disease.   In April 2025, a monitor revealed episodes of complete heart block with junctional escape and  brief episodes of second degree type one AV block. Despite these findings, he remains asymptomatic, with his heart rate occasionally dropping below forty, which he notices on his watch. No dizziness, shortness of breath, and only occasional fatigue are reported.  He has a history of sleep apnea but did not tolerate CPAP therapy. He was referred for the [facility] device but did not pursue it due to cost concerns.  He has stopped working out for about a month due to concerns about his heart condition, although he felt good while exercising previously. He mentions being able to walk at a fair pace and perform stretching and weight lifting exercises without significant issues.           Today, he reports that he is at baseline.  No fatigue, shortness of breath, presyncope, syncope.  EKGs/Labs/Other Studies Reviewed Today:     Echocardiogram:  TTE ordered Results pending   Monitors:  7 day monitor April 2025  -- my interpretation Sinus rhythm HR 54-143 bpm, avg 90 No atrial ectopy, <1% PVCs Brief episodes of third-degree block were noted with a junctional escape of about 35 bpm Second-degree AV block, Mobitz 1 associated with patient triggered event    EKG:         Physical Exam:    VS:  BP 100/70   Pulse (!) 45   Ht 6' (1.829 m)   Wt 193 lb 9.6 oz (87.8 kg)   SpO2 99%   BMI 26.26 kg/m     Wt Readings from Last 3  Encounters:  08/20/23 193 lb 9.6 oz (87.8 kg)  06/06/23 187 lb (84.8 kg)  06/03/23 185 lb (83.9 kg)     GEN: Well nourished, well developed in no acute distress CARDIAC: RRR, no murmurs, rubs, gallops RESPIRATORY:  Normal work of breathing MUSCULOSKELETAL: no edema    ASSESSMENT & PLAN:     Secondary AV block type I A-V dissociation noted transiently during monitoring I am fairly convinced that his level of block is in the AV node Will plan for routine treadmill stress test to assess AV conduction with stress  If he continues to have heart block  with exercise, we will plan for dual-chamber pacemaker Will order TTE to ensure heart structure and function are normal  Obstructive sleep apnea Intolerant of CPAP Investigating the inspire device and pursuing insurance approval    Signed, Eulas FORBES Furbish, MD  08/20/2023 3:04 PM    [facility]

## 2023-09-04 NOTE — Progress Notes (Deleted)
 Cardiology Office Note    Patient Name: Glen Carrillo Date of Encounter: 09/04/2023  Primary Care Provider:  Lanis Thresa BROCKS, PA-C Primary Cardiologist:  Glen Scarce, MD Primary Electrophysiologist: Glen FORBES Furbish, MD   Past Medical History    Past Medical History:  Diagnosis Date   Arthritis    Essential hypertension 12/25/2020   GERD (gastroesophageal reflux disease)    Hypertension    Pre-diabetes    Pure hypercholesterolemia 12/25/2020   Second degree atrioventricular block, Mobitz (type) I 12/25/2020    History of Present Illness  Glen Carrillo is a 68 y.o. male with a PMH of HTN, OSA, second-degree AVB type I, aortic atherosclerosis, former tobacco abuse, HLD, GERD, prediabetes who presents today for follow-up.   [name] presents today for 63-month follow-up.  During his previous visit he was evaluated for preoperative clearance and was noted to have abnormal EKG with sinus bradycardia and second-degree AV block previously.  He was asymptomatic and was concerned regarding possible diagnosis of atrial fibrillation.  He noted episodes of low heart rate in the 40s and was provided a 7-day live ZIO monitor that showed episodes of complete heart block with junctional escape and second-degree type I AVB.  He was referred to Dr. Mealor for evaluation of abnormal event monitor.  He reported remaining asymptomatic with occasional fatigue.  He continued to workout and weight lift and walk without any symptoms.  He was advised to complete a exercise treadmill stress test to assess AV conduction along with TTE to evaluate heart structure.    Patient denies chest pain, palpitations, dyspnea, PND, orthopnea, nausea, vomiting, dizziness, syncope, edema, weight gain, or early satiety.   Discussed the use of AI scribe software for clinical note transcription with the patient, who gave verbal consent to proceed.  History of Present Illness    ***Notes:   Review of  Systems  Please see the history of present illness.    All other systems reviewed and are otherwise negative except as noted above.  Physical Exam    Wt Readings from Last 3 Encounters:  08/20/23 193 lb 9.6 oz (87.8 kg)  06/06/23 187 lb (84.8 kg)  06/03/23 185 lb (83.9 kg)   CD:Uyzmz were no vitals filed for this visit.,There is no height or weight on file to calculate BMI. GEN: Well nourished, well developed in no acute distress Neck: No JVD; No carotid bruits Pulmonary: Clear to auscultation without rales, wheezing or rhonchi  Cardiovascular: Normal rate. Regular rhythm. Normal S1. Normal S2.   Murmurs: There is no murmur.  ABDOMEN: Soft, non-tender, non-distended EXTREMITIES:  No edema; No deformity   EKG/LABS/ Recent Cardiac Studies   ECG personally reviewed by me today - ***  Risk Assessment/Calculations:   {Does this patient have ATRIAL FIBRILLATION?:[id number]}      Lab Results  Component Value Date   WBC 9.3 10/14/2022   HGB 12.5 (L) 10/14/2022   HCT 37.6 (L) 10/14/2022   MCV 99.2 10/14/2022   PLT 199 10/14/2022   Lab Results  Component Value Date   CREATININE 1.18 10/14/2022   BUN 27 (H) 10/14/2022   NA 138 10/14/2022   K 4.2 10/14/2022   CL 102 10/14/2022   CO2 25 10/14/2022   No results found for: CHOL, HDL, LDLCALC, LDLDIRECT, TRIG, CHOLHDL  Lab Results  Component Value Date   HGBA1C 5.7 (H) 02/09/2021   Assessment & Plan    Assessment and Plan Assessment & Plan     1.***  2.***  3.***  4.***      Disposition: Follow-up with Glen Scarce, MD or APP in *** months {Are you ordering a CV Procedure (e.g. stress test, cath, DCCV, TEE, etc)?   Press F2        :[id number]}   Signed, Wyn Raddle, Jackee Shove, NP 09/04/2023, 7:05 PM [facility]

## 2023-09-05 ENCOUNTER — Ambulatory Visit: Admitting: Nurse Practitioner

## 2023-09-17 NOTE — Progress Notes (Unsigned)
 Cardiology Office Note    Patient Name: Glen Carrillo Date of Encounter: 09/17/2023  Primary Care Provider:  Lanis Thresa BROCKS, PA-C Primary Cardiologist:  Glen Scarce, MD Primary Electrophysiologist: Glen FORBES Furbish, MD   Past Medical History    Past Medical History:  Diagnosis Date   Arthritis    Essential hypertension 12/25/2020   GERD (gastroesophageal reflux disease)    Hypertension    Pre-diabetes    Pure hypercholesterolemia 12/25/2020   Second degree atrioventricular block, Mobitz (type) I 12/25/2020    History of Present Illness  Glen Carrillo is a 68 y.o. male with a PMH of HTN, OSA, second-degree AVB type I, aortic atherosclerosis, former tobacco abuse, HLD, GERD, prediabetes who presents today for follow-up.   Glen Carrillo presents today for 19-month follow-up.  During his previous visit he was evaluated for preoperative clearance and was noted to have abnormal EKG with sinus bradycardia and second-degree AV block previously.  He was asymptomatic and was concerned regarding possible diagnosis of atrial fibrillation.  He noted episodes of low heart rate in the 40s and was provided a 7-day live ZIO monitor that showed episodes of complete heart block with junctional escape and second-degree type I AVB.  He was referred to Glen Carrillo for evaluation of abnormal event monitor and was seen on 08/20/2023.  He reported remaining asymptomatic with occasional fatigue.  He continued to workout and weight lift and walk without any symptoms.  He was advised to complete a exercise treadmill stress test to assess AV conduction along with TTE to evaluate heart structure.  These test were scheduled to be completed on 10/03/2023.  Glen Carrillo presents today for 75-month follow-up. He has previously had low heart rates, consistent with prior readings, but experiences no symptoms such as fatigue or tiredness. He feels well enough to engage in activities like playing golf. He underwent an event  monitor test previously, which showed some abnormalities. Concerns about atrial fibrillation have been addressed, and he no longer has worries about this condition. He remains active, engaging in activities such as moving items from a house he is preparing to sell. He requires a shower every afternoon due to the physical activity involved. He experiences some swelling at the end of the day, likely related to his knees, and notes a persistent numbness down the side of one leg, although it improves after a few steps. He is currently managing his medications well and does not require any refills. No new concerns or complaints have arisen since his last visit. Patient denies chest pain, palpitations, dyspnea, PND, orthopnea, nausea, vomiting, dizziness, syncope, edema, weight gain, or early satiety.  Discussed the use of AI scribe software for clinical note transcription with the patient, who gave verbal consent to proceed.  History of Present Illness   eview of Systems  Please see the history of present illness.    All other systems reviewed and are otherwise negative except as noted above.  Physical Exam    Wt Readings from Last 3 Encounters:  08/20/23 193 lb 9.6 oz (87.8 kg)  06/06/23 187 lb (84.8 kg)  06/03/23 185 lb (83.9 kg)   CD:Uyzmz were no vitals filed for this visit.,There is no height or weight on file to calculate BMI. GEN: Well nourished, well developed in no acute distress Neck: No JVD; No carotid bruits Pulmonary: Clear to auscultation without rales, wheezing or rhonchi  Cardiovascular: Bradycardia/regular rhythm. Normal S1. Normal S2.   Murmurs: There is no murmur.  ABDOMEN:  Soft, non-tender, non-distended EXTREMITIES:  No edema; No deformity   EKG/LABS/ Recent Cardiac Studies   ECG personally reviewed by me today -none completed today  Risk Assessment/Calculations:          Lab Results  Component Value Date   WBC 9.3 10/14/2022   HGB 12.5 (L) 10/14/2022   HCT 37.6  (L) 10/14/2022   MCV 99.2 10/14/2022   PLT 199 10/14/2022   Lab Results  Component Value Date   CREATININE 1.18 10/14/2022   BUN 27 (H) 10/14/2022   NA 138 10/14/2022   K 4.2 10/14/2022   CL 102 10/14/2022   CO2 25 10/14/2022   No results found for: CHOL, HDL, LDLCALC, LDLDIRECT, TRIG, CHOLHDL  Lab Results  Component Value Date   HGBA1C 5.7 (H) 02/09/2021   Assessment & Plan    Assessment & Plan  1.Second-degree AV block type: -Patient's previous event monitor revealed bradycardia and AV block. - Patient evaluated by EP with pending 2D echo and ETT to evaluate AV conduction. - Patient currently asymptomatic with no changes since previous follow-up.  2.  Hyperlipidemia: - Patient's last LDL cholesterol was 64 on 88/49/75 - Continue Lipitor 40 mg daily  3.  Essential hypertension: - Patient's blood pressure today was stable at 132/72 - Continue Zestoretic  20-25 mg daily  4.Coronary calcifications: -Patient reports no symptoms of angina or chest discomfort. -Continue ezetimibe  10 mg and atorvastatin  40 mg  Disposition: Follow-up with Glen Scarce, MD or APP in 4 months    Signed, Glen Carrillo, Glen Shove, NP 09/17/2023, 1:09 PM [facility]

## 2023-09-18 ENCOUNTER — Ambulatory Visit: Attending: Nurse Practitioner | Admitting: Nurse Practitioner

## 2023-09-18 ENCOUNTER — Encounter: Payer: Self-pay | Admitting: Nurse Practitioner

## 2023-09-18 VITALS — BP 132/72 | HR 82 | Ht 72.0 in | Wt 192.8 lb

## 2023-09-18 DIAGNOSIS — I441 Atrioventricular block, second degree: Secondary | ICD-10-CM | POA: Diagnosis not present

## 2023-09-18 DIAGNOSIS — I10 Essential (primary) hypertension: Secondary | ICD-10-CM | POA: Diagnosis not present

## 2023-09-18 DIAGNOSIS — I2510 Atherosclerotic heart disease of native coronary artery without angina pectoris: Secondary | ICD-10-CM | POA: Insufficient documentation

## 2023-09-18 DIAGNOSIS — E785 Hyperlipidemia, unspecified: Secondary | ICD-10-CM | POA: Diagnosis not present

## 2023-09-18 NOTE — Patient Instructions (Signed)
 Medication Instructions:  Your physician recommends that you continue on your current medications as directed. Please refer to the Current Medication list given to you today. *If you need a refill on your cardiac medications before your next appointment, please call your pharmacy*  Lab Work: NONE ORDERED If you have labs (blood work) drawn today and your tests are completely normal, you will receive your results only by: MyChart Message (if you have MyChart) OR A paper copy in the mail If you have any lab test that is abnormal or we need to change your treatment, we will call you to review the results.  Testing/Procedures: NONE ORDERED  Follow-Up: At [facility], you and your health needs are our priority.  As part of our continuing mission to provide you with exceptional heart care, our providers are all part of one team.  This team includes your primary Cardiologist (physician) and Advanced Practice Providers or APPs (Physician Assistants and Nurse Practitioners) who all work together to provide you with the care you need, when you need it.  Your next appointment:   3-4 month(s)  Provider:   Annabella Scarce, MD    We recommend signing up for the patient portal called MyChart.  Sign up information is provided on this After Visit Summary.  MyChart is used to connect with patients for Virtual Visits (Telemedicine).  Patients are able to view lab/test results, encounter notes, upcoming appointments, etc.  Non-urgent messages can be sent to your provider as well.   To learn more about what you can do with MyChart, go to [email or web address].   Other Instructions

## 2023-09-23 ENCOUNTER — Telehealth (HOSPITAL_COMMUNITY): Payer: Self-pay

## 2023-09-23 NOTE — Telephone Encounter (Signed)
 Detailed instructions left on the patient's answering machine. S.Jassiel Flye CCT

## 2023-10-03 ENCOUNTER — Ambulatory Visit (HOSPITAL_COMMUNITY)
Admission: RE | Admit: 2023-10-03 | Discharge: 2023-10-03 | Disposition: A | Discharge status: Home / Self Care | Source: Ambulatory Visit | Attending: Cardiology | Admitting: Cardiology

## 2023-10-03 ENCOUNTER — Ambulatory Visit (HOSPITAL_BASED_OUTPATIENT_CLINIC_OR_DEPARTMENT_OTHER)
Admission: RE | Admit: 2023-10-03 | Discharge: 2023-10-03 | Disposition: A | Discharge status: Home / Self Care | Source: Ambulatory Visit | Attending: Cardiovascular Disease | Admitting: Cardiovascular Disease

## 2023-10-03 DIAGNOSIS — I459 Conduction disorder, unspecified: Secondary | ICD-10-CM | POA: Diagnosis not present

## 2023-10-03 DIAGNOSIS — I10 Essential (primary) hypertension: Secondary | ICD-10-CM

## 2023-10-03 DIAGNOSIS — I441 Atrioventricular block, second degree: Secondary | ICD-10-CM | POA: Diagnosis not present

## 2023-10-03 LAB — EXERCISE TOLERANCE TEST
Angina Index: 0
Duke Treadmill Score: 5
Estimated workload: 6.4
Exercise duration (min): 4 min
Exercise duration (sec): 30 s
MPHR: 151 {beats}/min
Peak HR: 115 {beats}/min
Percent HR: 76 %
RPE: 19
Rest HR: 47 {beats}/min
ST Depression (mm): 0 mm

## 2023-10-03 LAB — ECHOCARDIOGRAM COMPLETE
AR max vel: 2.64 cm2
AV Area VTI: 2.52 cm2
AV Area mean vel: 2.35 cm2
AV Mean grad: 7 mmHg
AV Peak grad: 13.1 mmHg
Ao pk vel: 1.81 m/s
Area-P 1/2: 4.1 cm2
S' Lateral: 2.8 cm

## 2023-10-05 ENCOUNTER — Ambulatory Visit: Payer: Self-pay | Admitting: Cardiovascular Disease

## 2023-10-15 ENCOUNTER — Ambulatory Visit: Attending: Cardiology | Admitting: Cardiovascular Disease

## 2023-10-15 ENCOUNTER — Encounter: Payer: Self-pay | Admitting: Cardiovascular Disease

## 2023-10-15 VITALS — BP 118/74 | HR 36 | Ht 72.0 in | Wt 192.0 lb

## 2023-10-15 DIAGNOSIS — Z01812 Encounter for preprocedural laboratory examination: Secondary | ICD-10-CM | POA: Insufficient documentation

## 2023-10-15 DIAGNOSIS — I441 Atrioventricular block, second degree: Secondary | ICD-10-CM | POA: Diagnosis not present

## 2023-10-15 NOTE — Addendum Note (Signed)
 Addended by: CASIMIR ALDONA BRAVO on: 10/15/2023 06:20 PM   Modules accepted: Orders

## 2023-10-15 NOTE — Patient Instructions (Signed)
 Medication Instructions:  Your physician recommends that you continue on your current medications as directed. Please refer to the Current Medication list given to you today.  *If you need a refill on your cardiac medications before your next appointment, please call your pharmacy*  Lab Work: None ordered. today If you have labs (blood work) drawn today and your tests are completely normal, you will receive your results only by: MyChart Message (if you have MyChart) OR A paper copy in the mail If you have any lab test that is abnormal or we need to change your treatment, we will call you to review the results.  Testing/Procedures: Your physician has recommended that you have a pacemaker inserted. A pacemaker is a small device that is placed under the skin of your chest or abdomen to help control abnormal heart rhythms. This device uses electrical pulses to prompt the heart to beat at a normal rate. Pacemakers are used to treat heart rhythms that are too slow. Wire (leads) are attached to the pacemaker that goes into the chambers of you heart. This is done in the hospital and usually requires and overnight stay. Please see the instruction sheet given to you today for more information.    Follow-Up: At [facility], you and your health needs are our priority.  As part of our continuing mission to provide you with exceptional heart care, our providers are all part of one team.  This team includes your primary Cardiologist (physician) and Advanced Practice Providers or APPs (Physician Assistants and Nurse Practitioners) who all work together to provide you with the care you need, when you need it.  Your next appointment:   To be scheduled

## 2023-10-15 NOTE — Progress Notes (Signed)
 Electrophysiology Office Note:    Date:  10/15/2023   ID:  Glen Carrillo, DOB 1956/01/01, MRN [id number]  PCP:  [facility] Providers Cardiologist:  Annabella Scarce, MD Electrophysiologist:  Eulas FORBES Furbish, MD     Referring MD: Lanis Thresa BROCKS, PA-C   History of Present Illness:    Glen Carrillo is a 68 y.o. male with a medical history significant for obstructive sleep apnea, bilateral knee replacement, referred for evaluation and management of bradycardia due to AV block.     He was seen for preoperative evaluation and October 2022 noted to have second-degree type I heart block on EKG at that time.  He was asymptomatic though, so no further intervention was pursued.  He has noted light headedness with turning his head and had a carotid ultrasound that showed minimal occlusive disease.  He has been diagnosed with sleep apnea but did not tolerate CPAP.  He was referred for the inspire device but has not pursued it due to cost concerns.  Another monitor was placed that had showed episodes of complete heart block with junctional escape as well as brief episodes of second-degree type I AV block.  There are several phone notes documenting call the patient after events stating that he was asymptomatic.  Discussed the use of AI scribe software for clinical note transcription with the patient, who gave verbal consent to proceed.  History of Present Illness Glen Carrillo is a 68 year old male with second degree type one heart block who presents with concerns about abnormal heart rhythms.  In October 2022, during a preoperative evaluation, a second degree type one heart block was identified on EKG. At that time, he was asymptomatic, and no further intervention was pursued. He has experienced symptoms of lightheadedness when turning his head. A carotid ultrasound showed minimal occlusive disease.   In April 2025, a monitor revealed  episodes of complete heart block with junctional escape and brief episodes of second degree type one AV block. Despite these findings, he remains asymptomatic, with his heart rate occasionally dropping below forty, which he notices on his watch. No dizziness, shortness of breath, and only occasional fatigue are reported.  He has a history of sleep apnea but did not tolerate CPAP therapy. He was referred for the [facility] device but did not pursue it due to cost concerns.  He has stopped working out for about a month due to concerns about his heart condition, although he felt good while exercising previously. He mentions being able to walk at a fair pace and perform stretching and weight lifting exercises without significant issues.  He returns today for follow-up after stress test and echocardiogram.  He notes now that he is quite fatigued while at rest.  He falls asleep easily while watching movies.  When he gets up and is active he feels fine.         Today, he reports that he is at baseline.  No fatigue, shortness of breath, presyncope, syncope.  EKGs/Labs/Other Studies Reviewed Today:     Echocardiogram:  TTE October 03, 2023 LVEF 60 to 65%.  Normal RV function.  Left and right atria are mildly dilated.  No mitral stenosis.   Monitors:  7 day monitor April 2025  -- my interpretation Sinus rhythm HR 54-143 bpm, avg 90 No atrial ectopy, <1% PVCs Brief episodes of third-degree block were noted with a junctional escape of about 35 bpm Second-degree AV  block, Mobitz 1 associated with patient triggered event  Stress test.   Patient exercised for 4 minutes 3 seconds with him maximal heart rate of 115 bpm.  Conduction normalized to 1:1 with exertion.  EKG:   EKG Interpretation Date/Time:  Wednesday October 15 2023 09:54:02 EDT Ventricular Rate:  36 PR Interval:  202 QRS Duration:  82 QT Interval:  486 QTC Calculation: 375 R Axis:   90  Text Interpretation: Marked sinus bradycardia  Rightward axis When compared with ECG of 20-Aug-2023 14:24, No significant change was found Confirmed by Nancey Scotts [phone]) on 10/15/2023 10:10:25 AM     Physical Exam:    VS:  BP 118/74 (BP Location: Right Arm, Patient Position: Sitting, Cuff Size: Large)   Pulse (!) 36   Ht 6' (1.829 m)   Wt 192 lb (87.1 kg)   SpO2 98%   BMI 26.04 kg/m     Wt Readings from Last 3 Encounters:  10/15/23 192 lb (87.1 kg)  10/03/23 193 lb (87.5 kg)  09/18/23 192 lb 12.8 oz (87.5 kg)     GEN: Well nourished, well developed in no acute distress CARDIAC: RRR, no murmurs, rubs, gallops RESPIRATORY:  Normal work of breathing MUSCULOSKELETAL: no edema    ASSESSMENT & PLAN:     Secondary AV block type I A-V dissociation noted transiently during monitoring Normalization of conduction (1:1 with 1st degree AV block) occurred during routine stress treadmill Today, he notes that he is very fatigued at rest and falls asleep easily.  He feels well when he is exerting himself Based upon these symptoms, he is interested in having a pacemaker placed. I explained the pacemaker procedure including risks of heart or lung perforation, bleeding, infection, device malfunction, infection, prolonged hospital stay, need for cardiac surgery, and a very low but not 0 risk of death.  He would like to schedule the procedure for October or early November.  Obstructive sleep apnea Intolerant of CPAP Investigating the inspire device and pursuing insurance approval    Signed, Scotts FORBES Nancey, MD  10/15/2023 10:12 AM    [facility]

## 2023-10-15 NOTE — Addendum Note (Signed)
 Addended by: CASIMIR ALDONA BRAVO on: 10/15/2023 06:23 PM   Modules accepted: Orders

## 2023-10-16 ENCOUNTER — Telehealth: Payer: Self-pay | Admitting: Cardiovascular Disease

## 2023-10-16 NOTE — Telephone Encounter (Signed)
 Pt dropped off Medical Clearance request for dental work to be filled out.   Location: Dr. Nancey mailbox

## 2023-10-24 ENCOUNTER — Telehealth: Payer: Self-pay

## 2023-10-24 NOTE — Telephone Encounter (Signed)
   Pre-operative Risk Assessment    Patient Name: Glen Carrillo  DOB: [birth year] MRN: [id number]   Date of last office visit: 10/15/23 Date of next office visit: Not scheduled   Request for Surgical Clearance    Procedure:  Periodontal Maintenance with Ultrasonic instrumentation  Date of Surgery:  Clearance TBD                                Surgeon:  Dr. Aguero Surgeon's Group or Practice Name:  [facility] Phone number:  [phone] Fax number:  [phone]   Type of Clearance Requested:   - Medical    Type of Anesthesia:  Not Indicated   Additional requests/questions:  Patient is on Aspirin  Bonney Ival LOISE Gerome   10/24/2023, 8:20 AM

## 2023-10-24 NOTE — Telephone Encounter (Signed)
 Jackee,  You saw this patient on 09/18/2023. Per protocol we request that you comment on his cardiac risk to proceed with  Periodontal Maintenance with Ultrasonic instrumentation  since it has been less than 2 months since evaluated in the office. Please send your comment to P CV Pre-Op Pool.  Thank you, Lamarr Satterfield DNP, ANP, AACC.

## 2023-10-27 NOTE — Telephone Encounter (Signed)
   Patient Name: Glen Carrillo  DOB: [birth year] MRN: [id number]  Primary Cardiologist: Annabella Scarce, MD  Chart reviewed as part of pre-operative protocol coverage. Given past medical history and time since last visit, based on ACC/AHA guidelines, SHADOE BETHEL is at acceptable risk for the planned procedure without further cardiovascular testing.   Per Jackee, NP: Mr. Teaster was seen in follow-up in July with no new acute changes and will be at acceptable risk for dental procedure without anesthesia.  SBE prophylaxis is not required for the patient.  I will route this recommendation to the requesting party via Epic fax function and remove from pre-op pool.  Please call with questions.  Lum LITTIE Louis, NP 10/27/2023, 11:55 AM

## 2023-10-27 NOTE — Telephone Encounter (Signed)
 I will forward this to the preop APP for today to review notes from Jackee Alberts, NP. As well if any further notes or recommendations are needed.

## 2023-10-27 NOTE — Telephone Encounter (Signed)
 Clearance completed on 10/24/2023.  See encounter for complete details.

## 2023-12-30 ENCOUNTER — Ambulatory Visit (INDEPENDENT_AMBULATORY_CARE_PROVIDER_SITE_OTHER): Admitting: Cardiovascular Disease

## 2023-12-30 ENCOUNTER — Encounter (HOSPITAL_BASED_OUTPATIENT_CLINIC_OR_DEPARTMENT_OTHER): Payer: Self-pay | Admitting: Cardiovascular Disease

## 2023-12-30 VITALS — BP 128/80 | HR 58 | Ht 72.0 in | Wt 194.0 lb

## 2023-12-30 DIAGNOSIS — E7800 Pure hypercholesterolemia, unspecified: Secondary | ICD-10-CM

## 2023-12-30 DIAGNOSIS — I441 Atrioventricular block, second degree: Secondary | ICD-10-CM | POA: Diagnosis not present

## 2023-12-30 DIAGNOSIS — I10 Essential (primary) hypertension: Secondary | ICD-10-CM

## 2023-12-30 DIAGNOSIS — Z5181 Encounter for therapeutic drug level monitoring: Secondary | ICD-10-CM

## 2023-12-30 DIAGNOSIS — G4730 Sleep apnea, unspecified: Secondary | ICD-10-CM

## 2023-12-30 LAB — COMPREHENSIVE METABOLIC PANEL WITH GFR
ALT: 15 IU/L (ref 0–44)
AST: 26 IU/L (ref 0–40)
Albumin: 4.6 g/dL (ref 3.9–4.9)
Alkaline Phosphatase: 83 IU/L (ref 47–123)
BUN/Creatinine Ratio: 19 (ref 10–24)
BUN: 19 mg/dL (ref 8–27)
Bilirubin Total: 0.5 mg/dL (ref 0.0–1.2)
CO2: 23 mmol/L (ref 20–29)
Calcium: 9.7 mg/dL (ref 8.6–10.2)
Chloride: 102 mmol/L (ref 96–106)
Creatinine, Ser: 1.02 mg/dL (ref 0.76–1.27)
Globulin, Total: 2.2 g/dL (ref 1.5–4.5)
Glucose: 116 mg/dL — ABNORMAL HIGH (ref 70–99)
Potassium: 4.8 mmol/L (ref 3.5–5.2)
Sodium: 139 mmol/L (ref 134–144)
Total Protein: 6.8 g/dL (ref 6.0–8.5)
eGFR: 80 mL/min/1.73 (ref >59)

## 2023-12-30 LAB — LIPID PANEL
Chol/HDL Ratio: 2.1 ratio (ref 0.0–5.0)
Cholesterol, Total: 154 mg/dL (ref 100–199)
HDL: 73 mg/dL (ref >39)
LDL Chol Calc (NIH): 68 mg/dL (ref 0–99)
Triglycerides: 68 mg/dL (ref 0–149)
VLDL Cholesterol Cal: 13 mg/dL (ref 5–40)

## 2023-12-30 NOTE — Progress Notes (Signed)
 Cardiology Office Note:  .   Date:  12/30/2023  ID:  Glen Carrillo, DOB 12-Jun-1955, MRN [id number] PCP: [facility] Providers Cardiologist:  Annabella Scarce, MD Electrophysiologist:  Eulas FORBES Furbish, MD    History of Present Illness: Glen Carrillo    Glen Carrillo is a 68 y.o. male with a hx of Mobitz I second degree heart block, arthritis, OSA not on CPAP, GERD, and hypertension, here for follow up.  He was first seen 12/2020 for the evaluation of a preop EKG that revealed Mobitz 1 second-degree heart block.  He was asymptomatic.  He noted some occasional lightheadedness.  Carotid Dopplers revealed minimal carotid obstruction.  No further intervention occurred.  He again went for preoperative risk assessment and was found to have Mobitz 1 second-degree heart block and a ventricular rate of 48.  He was subsequently diagnosed with sleep apnea but unable to tolerate CPAP.  He has not pursued the inspire device due to cost.  He subsequently seen Dr. Furbish.  Echo 09/2023 revealed LVEF 60-65% with indeterminate diastolic function.  He was in 2-1 heart block.  He had a treadmill stress test where he achieved 6.4 METS on a Bruce protocol.  He had 2-1 type II AV block at baseline and his heart rate increased to a max of 115 bpm.  AV conduction normalized with exercise.  He continued to report fatigue and is scheduled to undergo pacemaker implantation.  Discussed the use of AI scribe software for clinical note transcription with the patient, who gave verbal consent to proceed.  History of Present Illness Glen Carrillo has been experiencing fatigue and dizziness for the past two years. He has a history of heart block type 1, identified during a pre-operative evaluation. Although asymptomatic regarding heart block, he notes increased fatigue and dizziness over time. A stress test showed a normal heart rate response to exercise, but he experienced fatigue quickly and felt  unsteady during the test.  His physical activity has decreased over the past two years, which he attributes to orthopedic issues rather than fatigue. He no longer hunts or engages in previous activities, limiting himself to walking in the yard. He experiences shortness of breath and wakes up a couple of times at night to use the bathroom but returns to sleep quickly.  He has a history of orthopedic issues, including total knee and shoulder replacements, a bad back, and plantar fasciitis in the left heel. He experiences stiffness and stress in his ligaments, which he attributes to his statin medication. He has stopped certain exercises and activities, such as rubber band exercises and bike riding.  He reports swelling in his right leg after prolonged activity, such as standing for four to five hours, which resolves with rest and elevation. He enjoys shooting pool and can stand for several hours without significant issues.  He has no orthopnea or PND.   He takes supplements like turmeric and beets. He has recently started taking B12 for energy. He is concerned that atorvastatin  may be contributing to his ligament issues and is considering alternative treatments.  Family history includes his 54 year old mother who recently had an episode of low magnesium and potassium, leading to severe health issues. She has experienced swelling and edema, which resolves after a day. He is concerned about similar issues potentially affecting him.  ROS:  As per HPI  Studies Reviewed: .       Echo 09/2023:  1. Left ventricular ejection fraction,  by estimation, is 60 to 65%. The  left ventricle has normal function. The left ventricle has no regional  wall motion abnormalities. Left ventricular diastolic parameters are  indeterminate.   2. Right ventricular systolic function is normal. The right ventricular  size is mildly enlarged. Tricuspid regurgitation signal is inadequate for  assessing PA pressure.   3. Left  atrial size was mildly dilated.   4. Right atrial size was mildly dilated.   5. The mitral valve is normal in structure. Trivial mitral valve  regurgitation. No evidence of mitral stenosis.   6. The aortic valve is tricuspid. There is mild calcification of the  aortic valve. Aortic valve regurgitation is not visualized. No aortic  stenosis is present.   7. The inferior vena cava is normal in size with <50% respiratory  variability, suggesting right atrial pressure of 8 mmHg.   8. The patient was in 2:1 heart block. Diastolic MR and TR noted.  TTE 09/2023:   Patient exercised for 4 min and 30 sec. Maximum HR of 115 bpm. MPHR 76.0%. Peak METS 6.4.   Resting ECG rhythm shows sinus bradycardia with 2:1 conduction and second-degree Type I AV block.   Stress ECG: Sinus tachycardia. No ST deviation was noted.   Recovery ECG: sinus bradycardia with 2:1 conduction   Hypertensive blood pressure and blunted heart rate response noted during stress.   Prior study not available for comparison.     Risk Assessment/Calculations:             Physical Exam:    VS:  BP 128/80   Pulse (!) 58   Ht 6' (1.829 m)   Wt 194 lb (88 kg)   SpO2 98%   BMI 26.31 kg/m  , BMI Body mass index is 26.31 kg/m. GENERAL:  Well appearing HEENT: Pupils equal round and reactive, fundi not visualized, oral mucosa unremarkable NECK:  No jugular venous distention, waveform within normal limits, carotid upstroke brisk and symmetric, no bruits, no thyromegaly LYMPHATICS:  No cervical adenopathy LUNGS:  Clear to auscultation bilaterally HEART:  RRR.  PMI not displaced or sustained,S1 and S2 within normal limits, no S3, no S4, no clicks, no rubs, no murmurs ABD:  Flat, positive bowel sounds normal in frequency in pitch, no bruits, no rebound, no guarding, no midline pulsatile mass, no hepatomegaly, no splenomegaly EXT:  2 plus pulses throughout, no edema, no cyanosis no clubbing SKIN:  No rashes no nodules NEURO:  Cranial  nerves II through XII grossly intact, motor grossly intact throughout PSYCH:  Cognitively intact, oriented to person place and time   ASSESSMENT AND PLAN: .    Assessment & Plan # Second degree atrioventricular block, Mobitz Type 1 Experiencing fatigue, dizziness, and shortness of breath. Pacemaker implantation recommended for symptom improvement and cardiac security. - Proceed with pacemaker implantation as recommended by electrophysiologist. - Encourage safe physical activity to maintain endurance and flexibility.  # Primary hypertension Blood pressure controlled at 120/80 mmHg with current medication regimen. - Continue hydrochlorothiazide  and lisinopril .  # Hypercholesterolemia Concerns about atorvastatin  causing musculoskeletal issues. Plan to assess symptom improvement by holding atorvastatin . - Hold atorvastatin  for two weeks. - Continue Zetia  and other medications. - Order lipid panel and comprehensive metabolic panel today. - Instruct him to send a MyChart message in two weeks to report on symptoms. - Consider alternative lipid-lowering therapy if symptoms improve after holding atorvastatin .    Dispo: f/u 6 months  Signed, Annabella Scarce, MD

## 2023-12-30 NOTE — Patient Instructions (Signed)
 Medication Instructions:  HOLD ATORVASTATIN  FOR TWO WEEKS THEN SEND MYCHART MESSAGE OR CALL WITH HOW YOU ARE FEELING   *If you need a refill on your cardiac medications before your next appointment, please call your pharmacy*  Lab Work: LP/CMET TODAY   If you have labs (blood work) drawn today and your tests are completely normal, you will receive your results only by: MyChart Message (if you have MyChart) OR A paper copy in the mail If you have any lab test that is abnormal or we need to change your treatment, we will call you to review the results.  Testing/Procedures: NONE  Follow-Up: At [facility], you and your health needs are our priority.  As part of our continuing mission to provide you with exceptional heart care, our providers are all part of one team.  This team includes your primary Cardiologist (physician) and Advanced Practice Providers or APPs (Physician Assistants and Nurse Practitioners) who all work together to provide you with the care you need, when you need it.  Your next appointment:   6 month(s)  Provider:   Annabella Scarce, MD, Rosaline Bane, NP, or Reche Finder, NP     We recommend signing up for the patient portal called MyChart.  Sign up information is provided on this After Visit Summary.  MyChart is used to connect with patients for Virtual Visits (Telemedicine).  Patients are able to view lab/test results, encounter notes, upcoming appointments, etc.  Non-urgent messages can be sent to your provider as well.   To learn more about what you can do with MyChart, go to [email or web address].

## 2024-01-05 ENCOUNTER — Ambulatory Visit (HOSPITAL_BASED_OUTPATIENT_CLINIC_OR_DEPARTMENT_OTHER): Payer: Self-pay | Admitting: Family

## 2024-01-13 ENCOUNTER — Encounter (HOSPITAL_COMMUNITY): Payer: Self-pay

## 2024-01-13 ENCOUNTER — Telehealth (HOSPITAL_COMMUNITY): Payer: Self-pay

## 2024-01-13 NOTE — Telephone Encounter (Signed)
 Attempted to reach patient to discuss upcoming procedure, no answer. Left VM for patient to return call.

## 2024-01-23 ENCOUNTER — Encounter (HOSPITAL_BASED_OUTPATIENT_CLINIC_OR_DEPARTMENT_OTHER): Payer: Self-pay | Admitting: Cardiovascular Disease

## 2024-01-27 LAB — BASIC METABOLIC PANEL WITH GFR
BUN/Creatinine Ratio: 23 (ref 10–24)
BUN: 26 mg/dL (ref 8–27)
CO2: 22 mmol/L (ref 20–29)
Calcium: 9.8 mg/dL (ref 8.6–10.2)
Chloride: 104 mmol/L (ref 96–106)
Creatinine, Ser: 1.14 mg/dL (ref 0.76–1.27)
Glucose: 102 mg/dL — ABNORMAL HIGH (ref 70–99)
Potassium: 4.5 mmol/L (ref 3.5–5.2)
Sodium: 142 mmol/L (ref 134–144)
eGFR: 70 mL/min/1.73 (ref >59)

## 2024-01-28 ENCOUNTER — Ambulatory Visit: Payer: Self-pay

## 2024-01-28 LAB — CBC
Hematocrit: 41.2 % (ref 37.5–51.0)
Hemoglobin: 13.5 g/dL (ref 13.0–17.7)
MCH: 33.9 pg — ABNORMAL HIGH (ref 26.6–33.0)
MCHC: 32.8 g/dL (ref 31.5–35.7)
MCV: 104 fL — ABNORMAL HIGH (ref 79–97)
Platelets: 227 x10E3/uL (ref 150–450)
RBC: 3.98 x10E6/uL — ABNORMAL LOW (ref 4.14–5.80)
RDW: 11.6 % (ref 11.6–15.4)
WBC: 5 x10E3/uL (ref 3.4–10.8)

## 2024-02-02 NOTE — Pre-Procedure Instructions (Signed)
 Notified patient of schedule change.  Instructed him to now arrive @ 0530 in the morning for his procedure.  He verbalized understanding.

## 2024-02-02 NOTE — Pre-Procedure Instructions (Signed)
 Attempted to call patient regarding procedure instructions.  Left voicemail on the following items: Arrival time 1100- new time Nothing to eat or drink after midnight No meds AM of procedure Responsible person to drive you home and stay with you for 24 hrs Wash with special soap night before and morning of procedure If on anti-coagulant drug instructions ASA- last dose should have been 11/22

## 2024-02-03 ENCOUNTER — Ambulatory Visit (HOSPITAL_COMMUNITY)
Admission: RE | Admit: 2024-02-03 | Discharge: 2024-02-03 | Disposition: A | Discharge status: Home / Self Care | Attending: Cardiovascular Disease | Admitting: Cardiovascular Disease

## 2024-02-03 ENCOUNTER — Other Ambulatory Visit: Payer: Self-pay

## 2024-02-03 ENCOUNTER — Ambulatory Visit (HOSPITAL_COMMUNITY)
Admission: RE | Disposition: A | Discharge status: Home / Self Care | Payer: Self-pay | Source: Home / Self Care | Attending: Cardiovascular Disease

## 2024-02-03 ENCOUNTER — Ambulatory Visit (HOSPITAL_COMMUNITY)

## 2024-02-03 DIAGNOSIS — G4733 Obstructive sleep apnea (adult) (pediatric): Secondary | ICD-10-CM | POA: Insufficient documentation

## 2024-02-03 DIAGNOSIS — I441 Atrioventricular block, second degree: Secondary | ICD-10-CM | POA: Diagnosis not present

## 2024-02-03 DIAGNOSIS — I442 Atrioventricular block, complete: Secondary | ICD-10-CM | POA: Diagnosis present

## 2024-02-03 DIAGNOSIS — R001 Bradycardia, unspecified: Secondary | ICD-10-CM | POA: Diagnosis not present

## 2024-02-03 DIAGNOSIS — Z96653 Presence of artificial knee joint, bilateral: Secondary | ICD-10-CM | POA: Insufficient documentation

## 2024-02-03 HISTORY — PX: PACEMAKER IMPLANT: EP1218

## 2024-02-03 SURGERY — PACEMAKER IMPLANT

## 2024-02-03 MED ORDER — POVIDONE-IODINE 10 % EX SWAB: 2.0000 | Freq: Once | CUTANEOUS | Status: DC

## 2024-02-03 MED ORDER — ASPIRIN 81 MG PO TBEC: 81.0000 mg | DELAYED_RELEASE_TABLET | Freq: Every day | ORAL | Status: AC

## 2024-02-03 MED ORDER — ONDANSETRON HCL 4 MG/2ML IJ SOLN: 4.0000 mg | Freq: Four times a day (QID) | INTRAMUSCULAR | Status: DC | PRN

## 2024-02-03 MED ORDER — LIDOCAINE HCL (PF) 1 % IJ SOLN
INTRAMUSCULAR | Status: DC | PRN
  Administered 2024-02-03: 30 mL

## 2024-02-03 MED ORDER — CEFAZOLIN SODIUM-DEXTROSE 1-4 GM/50ML-% IV SOLN: INTRAVENOUS | Status: DC | PRN

## 2024-02-03 MED ORDER — FENTANYL CITRATE (PF) 100 MCG/2ML IJ SOLN
INTRAMUSCULAR | Status: AC
  Filled 2024-02-03: qty 2

## 2024-02-03 MED ORDER — SODIUM CHLORIDE 0.9% FLUSH: 3.0000 mL | INTRAVENOUS | Status: DC | PRN

## 2024-02-03 MED ORDER — SODIUM CHLORIDE 0.9% FLUSH: 3.0000 mL | Freq: Two times a day (BID) | INTRAVENOUS | Status: DC

## 2024-02-03 MED ORDER — HEPARIN (PORCINE) IN NACL 1000-0.9 UT/500ML-% IV SOLN
INTRAVENOUS | Status: DC | PRN
  Administered 2024-02-03: 500 mL

## 2024-02-03 MED ORDER — SODIUM CHLORIDE 0.9 % IV SOLN
INTRAVENOUS | Status: AC
  Administered 2024-02-03: 80 mg
  Filled 2024-02-03: qty 2

## 2024-02-03 MED ORDER — ACETAMINOPHEN 325 MG PO TABS: 650.0000 mg | ORAL_TABLET | ORAL | Status: DC | PRN

## 2024-02-03 MED ORDER — FENTANYL CITRATE (PF) 100 MCG/2ML IJ SOLN
INTRAMUSCULAR | Status: DC | PRN
  Administered 2024-02-03 (×2): 50 ug via INTRAVENOUS

## 2024-02-03 MED ORDER — CEFAZOLIN SODIUM-DEXTROSE 2-3 GM-%(50ML) IV SOLR
INTRAVENOUS | Status: DC | PRN
  Administered 2024-02-03: 2 g via INTRAVENOUS

## 2024-02-03 MED ORDER — SODIUM CHLORIDE 0.9 % IV SOLN: INTRAVENOUS | Status: DC

## 2024-02-03 MED ORDER — SODIUM CHLORIDE 0.9 % IV SOLN: 80.0000 mg | INTRAVENOUS | Status: AC

## 2024-02-03 MED ORDER — SODIUM CHLORIDE 0.9 % IV SOLN
INTRAVENOUS | Status: DC | PRN
  Administered 2024-02-03: 10 mL/h via INTRAVENOUS

## 2024-02-03 MED ORDER — LIDOCAINE HCL (PF) 1 % IJ SOLN
INTRAMUSCULAR | Status: AC
  Filled 2024-02-03: qty 60

## 2024-02-03 MED ORDER — CEFAZOLIN SODIUM-DEXTROSE 2-4 GM/100ML-% IV SOLN: 2.0000 g | INTRAVENOUS | Status: DC

## 2024-02-03 MED ORDER — CEFAZOLIN SODIUM-DEXTROSE 2-4 GM/100ML-% IV SOLN
INTRAVENOUS | Status: AC
  Filled 2024-02-03: qty 100

## 2024-02-03 MED ORDER — MIDAZOLAM HCL (PF) 2 MG/2ML IJ SOLN
INTRAMUSCULAR | Status: DC | PRN
  Administered 2024-02-03 (×2): 1 mg via INTRAVENOUS

## 2024-02-03 MED ORDER — MIDAZOLAM HCL 2 MG/2ML IJ SOLN
INTRAMUSCULAR | Status: AC
  Filled 2024-02-03: qty 2

## 2024-02-03 MED ORDER — SODIUM CHLORIDE 0.9 % IV SOLN: 250.0000 mL | INTRAVENOUS | Status: DC | PRN

## 2024-02-03 MED ORDER — CHLORHEXIDINE GLUCONATE 4 % EX SOLN: 4.0000 | Freq: Once | CUTANEOUS | Status: DC

## 2024-02-03 SURGICAL SUPPLY — 13 items
CABLE SURGICAL S-101-97-12 (CABLE) ×1 IMPLANT
CATH CPS LOCATOR 3D MED (CATHETERS) IMPLANT
ELECT DEFIB PAD ADLT CADENCE (PAD) IMPLANT
KIT MICROPUNCTURE NIT STIFF (SHEATH) IMPLANT
LEAD ULTIPACE 52 LPA1231/52 (Lead) IMPLANT
LEAD ULTIPACE 65 LPA1231/65 (Lead) IMPLANT
PACEMAKER ASSURITY DR-RF (Pacemaker) IMPLANT
SHEATH 7FR PRELUDE SNAP 13 (SHEATH) IMPLANT
SHEATH 9FR PRELUDE SNAP 13 (SHEATH) IMPLANT
SLITTER AGILIS HISPRO (INSTRUMENTS) IMPLANT
TOOL HELIX LOCKING (MISCELLANEOUS) IMPLANT
TRAY PACEMAKER INSERTION (PACKS) ×1 IMPLANT
WIRE HI TORQ VERSACORE-J 145CM (WIRE) IMPLANT

## 2024-02-03 NOTE — Progress Notes (Signed)
 Discharge instructions reviewed with patient and spouse at bedside. Denies questions concerns. PT tolerated PO intake. Ambulated in the hallway, was able to void without difficulty. Seen by MD incision site remains clean dry and intact. No s/s of complications. PT escorted from the unit via wheel chair to personal vehicle.

## 2024-02-03 NOTE — H&P (Signed)
 Electrophysiology Office Note:    Date:  02/03/2024   ID:  Glen Carrillo, DOB Jul 02, 1955, MRN [id number]  PCP:  [facility] Providers Cardiologist:  Annabella Scarce, MD Electrophysiologist:  Eulas FORBES Furbish, MD     Referring MD: No ref. provider found   History of Present Illness:    Glen Carrillo is a 68 y.o. male with a medical history significant for obstructive sleep apnea, bilateral knee replacement, referred for evaluation and management of bradycardia due to AV block.     He was seen for preoperative evaluation and October 2022 noted to have second-degree type I heart block on EKG at that time.  He was asymptomatic though, so no further intervention was pursued.  He has noted light headedness with turning his head and had a carotid ultrasound that showed minimal occlusive disease.  He has been diagnosed with sleep apnea but did not tolerate CPAP.  He was referred for the inspire device but has not pursued it due to cost concerns.  Another monitor was placed that had showed episodes of complete heart block with junctional escape as well as brief episodes of second-degree type I AV block.  There are several phone notes documenting call the patient after events stating that he was asymptomatic.  Discussed the use of AI scribe software for clinical note transcription with the patient, who gave verbal consent to proceed.  History of Present Illness Glen Carrillo is a 68 year old male with second degree type one heart block who presents with concerns about abnormal heart rhythms.  In October 2022, during a preoperative evaluation, a second degree type one heart block was identified on EKG. At that time, he was asymptomatic, and no further intervention was pursued. He has experienced symptoms of lightheadedness when turning his head. A carotid ultrasound showed minimal occlusive disease.   In April 2025, a monitor revealed  episodes of complete heart block with junctional escape and brief episodes of second degree type one AV block. Despite these findings, he remains asymptomatic, with his heart rate occasionally dropping below forty, which he notices on his watch. No dizziness, shortness of breath, and only occasional fatigue are reported.  He has a history of sleep apnea but did not tolerate CPAP therapy. He was referred for the [employer] device but did not pursue it due to cost concerns.  He has stopped working out for about a month due to concerns about his heart condition, although he felt good while exercising previously. He mentions being able to walk at a fair pace and perform stretching and weight lifting exercises without significant issues.  He returns today for follow-up after stress test and echocardiogram.  He notes now that he is quite fatigued while at rest.  He falls asleep easily while watching movies.  When he gets up and is active he feels fine.         Today, he reports he is doing well. He continues to have some intermittent fatigue. He presents for device placement today. He has had no changes since his last clinic visit.  EKGs/Labs/Other Studies Reviewed Today:     Echocardiogram:  TTE October 03, 2023 LVEF 60 to 65%.  Normal RV function.  Left and right atria are mildly dilated.  No mitral stenosis.   Monitors:  7 day monitor April 2025  -- my interpretation Sinus rhythm HR 54-143 bpm, avg 90 No atrial ectopy, <1% PVCs Brief episodes of  third-degree block were noted with a junctional escape of about 35 bpm Second-degree AV block, Mobitz 1 associated with patient triggered event  Stress test.   Patient exercised for 4 minutes 3 seconds with him maximal heart rate of 115 bpm.  Conduction normalized to 1:1 with exertion.  EKG:         Physical Exam:    VS:  BP (!) 155/63   Pulse (!) 51   Temp 97.9 F (36.6 C) (Oral)   Resp 18   Ht 6' (1.829 m)   Wt 86.2 kg   SpO2 98%    BMI 25.77 kg/m     Wt Readings from Last 3 Encounters:  02/03/24 86.2 kg  12/30/23 88 kg  10/15/23 87.1 kg     GEN: Well nourished, well developed in no acute distress CARDIAC: RRR, no murmurs, rubs, gallops RESPIRATORY:  Normal work of breathing MUSCULOSKELETAL: no edema    ASSESSMENT & PLAN:     Secondary AV block type I A-V dissociation noted transiently during monitoring Normalization of conduction (1:1 with 1st degree AV block) occurred during routine stress treadmill Today, he notes that he is very fatigued at rest and falls asleep easily.  He feels well when he is exerting himself Based upon these symptoms, he is interested in having a pacemaker placed. I explained the pacemaker procedure including risks of heart or lung perforation, bleeding, infection, device malfunction, infection, prolonged hospital stay, need for cardiac surgery, and a very low but not 0 risk of death.  He would like to schedule the procedure for October or early November.  Obstructive sleep apnea Intolerant of CPAP Investigating the inspire device and pursuing insurance approval    Signed, Eulas FORBES Furbish, MD  02/03/2024 7:12 AM    [facility]

## 2024-02-03 NOTE — Discharge Instructions (Addendum)
 After Your Cardiac Device   You have a [employer] Cardiac Device  ACTIVITY Do not lift your arm above shoulder height for 1 week after your procedure. After 7 days, you may progress as below.  You should remove your sling 24 hours after your procedure, unless otherwise instructed by your provider.     Tuesday February 10, 2024  Wednesday February 11, 2024 Thursday February 12, 2024 Friday February 13, 2024   Do not lift, push, pull, or carry anything over 10 pounds with the affected arm until 6 weeks (Tuesday March 16, 2024 ) after your procedure.   You may drive AFTER your wound check, unless you have been told otherwise by your provider.   Ask your healthcare provider when you can go back to work   INCISION/Dressing If you are on a blood thinner such as Coumadin, Xarelto, Eliquis, Plavix, or Pradaxa please confirm with your provider when this should be resumed.   If large square, outer bandage is left in place, this can be removed after 24 hours from your procedure. Do not remove steri-strips or glue as below.   If a PRESSURE DRESSING (a bulky dressing that usually goes up over your shoulder) was applied or left in place, please follow instructions given by your provider on when to return to have this removed.   Monitor your cardiac device site for redness, swelling, and drainage. Call the device clinic at [phone] if you experience these symptoms or fever/chills.  If your incision is sealed with Steri-strips or staples, you may shower 7 days after your procedure or when told by your provider. Do not remove the steri-strips or let the shower hit directly on your site. You may wash around your site with soap and water .    If you were discharged in a sling, please do not wear this during the day more than 48 hours after your surgery unless otherwise instructed. This may increase the risk of stiffness and soreness in your shoulder.   Avoid lotions, ointments, or perfumes over  your incision until it is well-healed.  You may use a hot tub or a pool AFTER your wound check appointment if the incision is completely closed.   DEVICE MANAGEMENT You should receive your ID card for your new device in 4-8 weeks. Keep this card with you at all times once received. Consider wearing a medical alert bracelet or necklace.  Please follow manufacture instructions for keeping your device charged carefully. This should be performed every 2 weeks at the very least.   Your cardiac device may be MRI compatible. This will be discussed at your next office visit/wound check.  You should avoid contact with strong electric or magnetic fields.   Do not use amateur (ham) radio equipment or electric (arc) welding torches. MP3 player headphones with magnets should not be used. Some devices are safe to use if held at least 12 inches (30 cm) from your cardiac device. These include power tools, lawn mowers, and speakers. If you are unsure if something is safe to use, ask your health care provider.  When using your cell phone, hold it to the ear that is on the opposite side from the cardiac device. Do not leave your cell phone in a pocket over the cardiac device.  You may safely use electric blankets, heating pads, computers, and microwave ovens.  Call the office right away if: You have chest pain. You feel more short of breath than you have felt before. You feel more  light-headed than you have felt before. Your incision starts to open up.  This information is not intended to replace advice given to you by your health care provider. Make sure you discuss any questions you have with your health care provider.

## 2024-02-04 ENCOUNTER — Encounter (HOSPITAL_COMMUNITY): Payer: Self-pay | Admitting: Cardiovascular Disease

## 2024-02-04 MED FILL — Cefazolin Sodium-Dextrose IV Solution 2 GM/100ML-4%: INTRAVENOUS | Qty: 100 | Status: AC

## 2024-02-12 ENCOUNTER — Telehealth: Payer: Self-pay

## 2024-02-12 NOTE — Telephone Encounter (Signed)
 Follow-up after same day discharge: Implant date: 02/03/2024 MD: AM Device: PPM Location: L chest    Wound check visit: 02/17/2024 90 day MD follow-up: 05/05/2024  Remote Transmission received:yes  Dressing/sling removed: yes  Confirm OAC restart on: yes   Please continue to monitor your cardiac device site for redness, swelling, and drainage. Call the device clinic at [phone] if you experience these symptoms, fever/chills, or have questions about your device.   Remote monitoring is used to monitor your cardiac device from home. This monitoring is scheduled every 91 days by our office. It allows us  to keep an eye on the functioning of your device to ensure it is working properly.

## 2024-02-17 ENCOUNTER — Ambulatory Visit: Attending: Cardiology | Admitting: *Deleted

## 2024-02-17 DIAGNOSIS — I459 Conduction disorder, unspecified: Secondary | ICD-10-CM

## 2024-02-17 LAB — CUP PACEART INCLINIC DEVICE CHECK
Date Time Interrogation Session: 20251209141244
Implantable Lead Connection Status: 753985
Implantable Lead Connection Status: 753985
Implantable Lead Implant Date: 20251125
Implantable Lead Implant Date: 20251125
Implantable Lead Location: 753859
Implantable Lead Location: 753860
Implantable Pulse Generator Implant Date: 20251125
Pulse Gen Model: 2272
Pulse Gen Serial Number: 8327470

## 2024-02-17 NOTE — Patient Instructions (Signed)
   After Your Pacemaker   Monitor your pacemaker site for redness, swelling, and drainage. Call the device clinic at [phone] if you experience these symptoms or fever/chills.  Your incision was closed with Steri-strips or staples:  You may shower after today's visit and wash your incision with soap and water. Avoid lotions, ointments, or perfumes over your incision until it is well-healed.  You may use a hot tub or a pool after your wound check appointment if the incision is completely closed.  Do not lift, push or pull greater than 10 pounds with the affected arm until 6 weeks after your procedure. There are no other restrictions in arm movement after your wound check appointment.  You may drive, unless driving has been restricted by your healthcare providers.  Remote monitoring is used to monitor your pacemaker from home. This monitoring is scheduled every 91 days by our office. It allows us  to keep an eye on the functioning of your device to ensure it is working properly. You will routinely see your Electrophysiologist annually (more often if necessary).

## 2024-02-17 NOTE — Progress Notes (Signed)
 Normal dual chamber pacemaker wound check. Presenting rhythm: AS/VP. Wound well healed. Routine testing performed. Thresholds, sensing, and impedance consistent with implant measurements and at 3.5V safety margin/auto capture until 3 month visit. No episodes. Reviewed arm restrictions to continue for 6 weeks total post op.  Pt enrolled in remote follow-up.

## 2024-02-27 ENCOUNTER — Ambulatory Visit: Payer: Self-pay | Admitting: Cardiovascular Disease

## 2024-03-16 ENCOUNTER — Ambulatory Visit

## 2024-03-16 DIAGNOSIS — I459 Conduction disorder, unspecified: Secondary | ICD-10-CM | POA: Diagnosis not present

## 2024-03-17 LAB — CUP PACEART REMOTE DEVICE CHECK
Battery Remaining Longevity: 120 mo
Battery Remaining Percentage: 95.5 %
Battery Voltage: 3.02 V
Brady Statistic AP VP Percent: 7.8 %
Brady Statistic AP VS Percent: 1 %
Brady Statistic AS VP Percent: 91 %
Brady Statistic AS VS Percent: 1 %
Brady Statistic RA Percent Paced: 7.8 %
Brady Statistic RV Percent Paced: 99 %
Date Time Interrogation Session: 20260106023842
Implantable Lead Connection Status: 753985
Implantable Lead Connection Status: 753985
Implantable Lead Implant Date: 20251125
Implantable Lead Implant Date: 20251125
Implantable Lead Location: 753859
Implantable Lead Location: 753860
Implantable Pulse Generator Implant Date: 20251125
Lead Channel Impedance Value: 380 Ohm
Lead Channel Impedance Value: 450 Ohm
Lead Channel Pacing Threshold Amplitude: 0.375 V
Lead Channel Pacing Threshold Amplitude: 0.875 V
Lead Channel Pacing Threshold Pulse Width: 0.5 ms
Lead Channel Pacing Threshold Pulse Width: 0.5 ms
Lead Channel Sensing Intrinsic Amplitude: 12 mV
Lead Channel Sensing Intrinsic Amplitude: 5 mV
Lead Channel Setting Pacing Amplitude: 1.125
Lead Channel Setting Pacing Amplitude: 1.375
Lead Channel Setting Pacing Pulse Width: 0.5 ms
Lead Channel Setting Sensing Sensitivity: 2 mV
Pulse Gen Model: 2272
Pulse Gen Serial Number: 8327470

## 2024-03-18 NOTE — Progress Notes (Signed)
 Remote PPM Transmission

## 2024-03-20 ENCOUNTER — Ambulatory Visit: Payer: Self-pay | Admitting: Cardiovascular Disease

## 2024-05-05 ENCOUNTER — Ambulatory Visit: Admitting: Student

## 2024-06-15 ENCOUNTER — Ambulatory Visit

## 2024-09-14 ENCOUNTER — Ambulatory Visit
